# Patient Record
Sex: Male | Born: 1960 | Race: White | Hispanic: No | Marital: Married | Smoking: Never smoker
Health system: Southern US, Community
[De-identification: ages and names within clinical notes are randomized; demographics above are authoritative.]

## PROBLEM LIST (undated history)

## (undated) DIAGNOSIS — Z9889 Other specified postprocedural states: Secondary | ICD-10-CM

## (undated) DIAGNOSIS — T7840XA Allergy, unspecified, initial encounter: Secondary | ICD-10-CM

## (undated) DIAGNOSIS — E162 Hypoglycemia, unspecified: Secondary | ICD-10-CM

## (undated) DIAGNOSIS — H532 Diplopia: Secondary | ICD-10-CM

## (undated) DIAGNOSIS — K219 Gastro-esophageal reflux disease without esophagitis: Secondary | ICD-10-CM

## (undated) HISTORY — DX: Diplopia: H53.2

## (undated) HISTORY — PX: TONSILLECTOMY: SUR1361

## (undated) HISTORY — PX: OTHER SURGICAL HISTORY: SHX169

## (undated) HISTORY — DX: Gastro-esophageal reflux disease without esophagitis: K21.9

## (undated) HISTORY — DX: Allergy, unspecified, initial encounter: T78.40XA

---

## 2013-08-24 HISTORY — PX: COLONOSCOPY: SHX174

## 2015-12-04 ENCOUNTER — Ambulatory Visit: Payer: Self-pay | Admitting: Family Medicine

## 2015-12-09 ENCOUNTER — Ambulatory Visit (INDEPENDENT_AMBULATORY_CARE_PROVIDER_SITE_OTHER): Payer: BLUE CROSS/BLUE SHIELD | Admitting: Family Medicine

## 2015-12-09 ENCOUNTER — Encounter: Payer: Self-pay | Admitting: Family Medicine

## 2015-12-09 VITALS — BP 120/80 | HR 72 | Ht 75.0 in | Wt 175.0 lb

## 2015-12-09 DIAGNOSIS — K219 Gastro-esophageal reflux disease without esophagitis: Secondary | ICD-10-CM

## 2015-12-09 DIAGNOSIS — Z833 Family history of diabetes mellitus: Secondary | ICD-10-CM | POA: Diagnosis not present

## 2015-12-09 DIAGNOSIS — E162 Hypoglycemia, unspecified: Secondary | ICD-10-CM

## 2015-12-09 DIAGNOSIS — K4020 Bilateral inguinal hernia, without obstruction or gangrene, not specified as recurrent: Secondary | ICD-10-CM | POA: Diagnosis not present

## 2015-12-09 DIAGNOSIS — E161 Other hypoglycemia: Secondary | ICD-10-CM

## 2015-12-09 DIAGNOSIS — E559 Vitamin D deficiency, unspecified: Secondary | ICD-10-CM

## 2015-12-09 DIAGNOSIS — Z800 Family history of malignant neoplasm of digestive organs: Secondary | ICD-10-CM | POA: Diagnosis not present

## 2015-12-09 MED ORDER — RANITIDINE HCL 150 MG PO TABS: 150.0000 mg | ORAL_TABLET | Freq: Two times a day (BID) | ORAL | Status: DC

## 2015-12-10 DIAGNOSIS — E559 Vitamin D deficiency, unspecified: Secondary | ICD-10-CM | POA: Insufficient documentation

## 2015-12-10 DIAGNOSIS — K635 Polyp of colon: Secondary | ICD-10-CM | POA: Insufficient documentation

## 2015-12-10 DIAGNOSIS — Z833 Family history of diabetes mellitus: Secondary | ICD-10-CM | POA: Insufficient documentation

## 2015-12-10 LAB — CBC
HEMATOCRIT: 47 % (ref 37.5–51.0)
HEMOGLOBIN: 15.9 g/dL (ref 12.6–17.7)
MCH: 30.5 pg (ref 26.6–33.0)
MCHC: 33.8 g/dL (ref 31.5–35.7)
MCV: 90 fL (ref 79–97)
Platelets: 210 10*3/uL (ref 150–379)
RBC: 5.21 x10E6/uL (ref 4.14–5.80)
RDW: 13.3 % (ref 12.3–15.4)
WBC: 6.3 10*3/uL (ref 3.4–10.8)

## 2015-12-10 LAB — COMPREHENSIVE METABOLIC PANEL
ALK PHOS: 65 IU/L (ref 39–117)
ALT: 16 IU/L (ref 0–44)
AST: 16 IU/L (ref 0–40)
Albumin/Globulin Ratio: 2.2 (ref 1.2–2.2)
Albumin: 4.8 g/dL (ref 3.5–5.5)
BILIRUBIN TOTAL: 0.6 mg/dL (ref 0.0–1.2)
BUN/Creatinine Ratio: 20 (ref 9–20)
BUN: 19 mg/dL (ref 6–24)
CHLORIDE: 102 mmol/L (ref 96–106)
CO2: 25 mmol/L (ref 18–29)
Calcium: 9 mg/dL (ref 8.7–10.2)
Creatinine, Ser: 0.97 mg/dL (ref 0.76–1.27)
GFR calc Af Amer: 100 mL/min/{1.73_m2} (ref >59)
GFR calc non Af Amer: 87 mL/min/{1.73_m2} (ref >59)
GLUCOSE: 99 mg/dL (ref 65–99)
Globulin, Total: 2.2 g/dL (ref 1.5–4.5)
Potassium: 4.3 mmol/L (ref 3.5–5.2)
Sodium: 142 mmol/L (ref 134–144)
Total Protein: 7 g/dL (ref 6.0–8.5)

## 2015-12-10 LAB — TSH: TSH: 0.993 u[IU]/mL (ref 0.450–4.500)

## 2015-12-10 LAB — HEMOGLOBIN A1C
ESTIMATED AVERAGE GLUCOSE: 114 mg/dL
Hgb A1c MFr Bld: 5.6 % (ref 4.8–5.6)

## 2015-12-10 LAB — VITAMIN B12: VITAMIN B 12: 340 pg/mL (ref 211–946)

## 2015-12-10 LAB — VITAMIN D 25 HYDROXY (VIT D DEFICIENCY, FRACTURES): VIT D 25 HYDROXY: 22 ng/mL — AB (ref 30.0–100.0)

## 2015-12-10 MED ORDER — VITAMIN D3 25 MCG (1000 UT) PO CAPS: 1.0000 | ORAL_CAPSULE | Freq: Every day | ORAL | Status: DC

## 2015-12-10 NOTE — Addendum Note (Signed)
Addended by: Adline Potter on: 12/10/2015 08:33 AM   Modules accepted: Orders

## 2015-12-10 NOTE — Progress Notes (Signed)
Date:  12/09/2015   Name:  John Ashley   DOB:  [birth year]   MRN:  [id number]  PCP:  Adline Potter, MD    Chief Complaint: Establish Care   History of Present Illness:  This is a 55 y.o. male seen to establish care. Hx reactive hypoglycemia he manages with diet. GERD x 3-4 yrs on chronic PPI, sxs recur if misses dose, has not tried H2 blocker, EGD negative in past. Known painless bilateral inguinal hernias x yrs, L has gotten a little larger lately. Father with colon ca/DM, mother with DM, brother with colon ca. Colonoscopy 2015 small polyps for repeat 2018. Last tetanus imm 2015.  Review of Systems:  Review of Systems  Constitutional: Negative for fever and fatigue.  Respiratory: Negative for cough and shortness of breath.   Cardiovascular: Negative for chest pain and leg swelling.  Endocrine: Negative for polyuria.  Genitourinary: Negative for difficulty urinating.  Neurological: Negative for syncope and light-headedness.    Patient Active Problem List   Diagnosis Date Noted  . FH: diabetes mellitus 12/10/2015  . FH: colon cancer 12/10/2015  . Reactive hypoglycemia 12/09/2015  . Bilateral inguinal hernia 12/09/2015    Prior to Admission medications   Medication Sig Start Date End Date Taking? Authorizing Provider  cetirizine (ZYRTEC) 10 MG tablet Take 10 mg by mouth daily. otc   Yes Historical Provider, MD  ranitidine (ZANTAC) 150 MG tablet Take 1 tablet (150 mg total) by mouth 2 (two) times daily. 12/09/15   Adline Potter, MD    No Known Allergies  Past Surgical History  Procedure Laterality Date  . Perforated eardrum Right   . Tonsillectomy    . Colonoscopy  2015    repeat in 3 years d/t polyps    Social History  Substance Use Topics  . Smoking status: Never Smoker   . Smokeless tobacco: None  . Alcohol Use: 0.0 oz/week    0 Standard drinks or equivalent per week    Family History  Problem Relation Age of Onset  . Diabetes Mother   . Cancer Father   .  Diabetes Father   . Cancer Brother   . Cancer Paternal Grandmother   . Cancer Paternal Grandfather     Medication list has been reviewed and updated.  Physical Examination: BP 120/80 mmHg  Pulse 72  Ht 6\' 3"  (1.905 m)  Wt 175 lb (79.379 kg)  BMI 21.87 kg/m2  Physical Exam  Constitutional: He is oriented to person, place, and time. He appears well-developed and well-nourished.  HENT:  Head: Normocephalic and atraumatic.  Right Ear: External ear normal.  Left Ear: External ear normal.  Nose: Nose normal.  Mouth/Throat: Oropharynx is clear and moist.  R TM with scarring  Eyes: Conjunctivae and EOM are normal. Pupils are equal, round, and reactive to light.  Neck: Neck supple. No thyromegaly present.  Cardiovascular: Normal rate, regular rhythm and normal heart sounds.   Pulmonary/Chest: Effort normal and breath sounds normal.  Abdominal: Soft. He exhibits no distension and no mass. There is no tenderness.  Genitourinary: Penis normal.  B indirect inguinal hernias L>R, nontender, easily reducible, testes normal  Musculoskeletal: He exhibits no edema.  Lymphadenopathy:    He has no cervical adenopathy.  Neurological: He is alert and oriented to person, place, and time.  Skin: Skin is warm and dry.  Psychiatric: He has a normal mood and affect. His behavior is normal.  Nursing note and vitals reviewed.   Assessment and Plan:  1.  Reactive hypoglycemia Well managed with diet - Comprehensive Metabolic Panel (CMET) - CBC - HgB A1c - TSH  2. Bilateral inguinal hernia without obstruction or gangrene, recurrence not specified Nonpainful, consider surgical consult if become symptomatic  3. Gastroesophageal reflux disease without esophagitis Discussed risks chronic PPI use, will try change to Zantac - B12 - Vitamin D (25 hydroxy)  4. FH: diabetes mellitus Lifetime risk of diabetes high  5. FH: colon cancer For repeat colonoscopy 2018  Return in about 4 weeks (around  01/06/2016).  Satira Anis. [facility]  12/10/2015

## 2016-01-08 ENCOUNTER — Encounter: Payer: Self-pay | Admitting: Family Medicine

## 2016-01-08 ENCOUNTER — Ambulatory Visit (INDEPENDENT_AMBULATORY_CARE_PROVIDER_SITE_OTHER): Payer: BLUE CROSS/BLUE SHIELD | Admitting: Family Medicine

## 2016-01-08 VITALS — BP 106/76 | HR 84 | Resp 16 | Ht 73.0 in | Wt 171.0 lb

## 2016-01-08 DIAGNOSIS — J309 Allergic rhinitis, unspecified: Secondary | ICD-10-CM

## 2016-01-08 DIAGNOSIS — E162 Hypoglycemia, unspecified: Secondary | ICD-10-CM

## 2016-01-08 DIAGNOSIS — E559 Vitamin D deficiency, unspecified: Secondary | ICD-10-CM | POA: Diagnosis not present

## 2016-01-08 DIAGNOSIS — E161 Other hypoglycemia: Secondary | ICD-10-CM

## 2016-01-08 DIAGNOSIS — K219 Gastro-esophageal reflux disease without esophagitis: Secondary | ICD-10-CM | POA: Diagnosis not present

## 2016-01-08 DIAGNOSIS — K4020 Bilateral inguinal hernia, without obstruction or gangrene, not specified as recurrent: Secondary | ICD-10-CM | POA: Diagnosis not present

## 2016-01-08 NOTE — Progress Notes (Signed)
Date:  01/08/2016   Name:  John Ashley   DOB:  22-Oct-1960   MRN:  [id number]  PCP:  Adline Potter, MD    Chief Complaint: Heartburn   History of Present Illness:  This is a 55 y.o. male seen in one month f/u from initial visit. Blood work ok except for vit D def, on supplement. Tried to transition to Zantac but GERD sxs recurred and restarted omeprazole with resolution. No change in BIH, reactive hypoglycemia, or AR sxs.   Review of Systems:  Review of Systems  Constitutional: Negative for fever and fatigue.  Respiratory: Negative for cough and shortness of breath.   Cardiovascular: Negative for chest pain and leg swelling.  Neurological: Negative for syncope and light-headedness.    Patient Active Problem List   Diagnosis Date Noted  . GERD (gastroesophageal reflux disease) 01/08/2016  . FH: diabetes mellitus 12/10/2015  . FH: colon cancer 12/10/2015  . Vitamin D deficiency 12/10/2015  . Reactive hypoglycemia 12/09/2015  . Bilateral inguinal hernia 12/09/2015    Prior to Admission medications   Medication Sig Start Date End Date Taking? Authorizing Provider  cetirizine (ZYRTEC) 10 MG tablet Take 10 mg by mouth daily. otc   Yes Historical Provider, MD  Cholecalciferol (VITAMIN D3) 1000 units CAPS Take 1 capsule (1,000 Units total) by mouth daily. 12/10/15  Yes Adline Potter, MD  omeprazole (PRILOSEC) 20 MG capsule Take 20 mg by mouth daily.   Yes Historical Provider, MD    No Known Allergies  Past Surgical History  Procedure Laterality Date  . Perforated eardrum Right   . Tonsillectomy    . Colonoscopy  2015    repeat in 3 years d/t polyps    Social History  Substance Use Topics  . Smoking status: Never Smoker   . Smokeless tobacco: None  . Alcohol Use: 0.0 oz/week    0 Standard drinks or equivalent per week    Family History  Problem Relation Age of Onset  . Diabetes Mother   . Cancer Father   . Diabetes Father   . Cancer Brother   . Cancer Paternal  Grandmother   . Cancer Paternal Grandfather     Medication list has been reviewed and updated.  Physical Examination: BP 106/76 mmHg  Pulse 84  Resp 16  Ht 6\' 1"  (1.854 m)  Wt 171 lb (77.565 kg)  BMI 22.57 kg/m2  Physical Exam  Constitutional: He appears well-developed and well-nourished.  Cardiovascular: Normal rate and regular rhythm.   Pulmonary/Chest: Effort normal and breath sounds normal.  Musculoskeletal: He exhibits no edema.  Neurological: He is alert.  Skin: Skin is warm and dry.  Psychiatric: He has a normal mood and affect. His behavior is normal.  Nursing note and vitals reviewed.   Assessment and Plan:  1. Vitamin D deficiency On supplement - Vitamin D (25 hydroxy)  2. Gastroesophageal reflux disease, esophagitis presence not specified Intolerant of transition to H2 blocker, negative EGD in past, cont omeprazole  3. Bilateral inguinal hernia without obstruction or gangrene, recurrence not specified Stable  4. Reactive hypoglycemia Stable with small, frequent meals  5. Allergic rhinitis, unspecified allergic rhinitis type On chronic Zyrtec, consider d/c  Return in about 6 months (around 07/10/2016).  Satira Anis. [facility]  01/08/2016

## 2016-01-09 LAB — VITAMIN D 25 HYDROXY (VIT D DEFICIENCY, FRACTURES): VIT D 25 HYDROXY: 29.6 ng/mL — AB (ref 30.0–100.0)

## 2016-08-14 ENCOUNTER — Ambulatory Visit (INDEPENDENT_AMBULATORY_CARE_PROVIDER_SITE_OTHER): Payer: BLUE CROSS/BLUE SHIELD | Admitting: Internal Medicine

## 2016-08-14 ENCOUNTER — Encounter: Payer: Self-pay | Admitting: Internal Medicine

## 2016-08-14 VITALS — BP 122/88 | HR 73 | Temp 98.1°F | Ht 75.0 in | Wt 170.0 lb

## 2016-08-14 DIAGNOSIS — J0100 Acute maxillary sinusitis, unspecified: Secondary | ICD-10-CM

## 2016-08-14 MED ORDER — AMOXICILLIN-POT CLAVULANATE 875-125 MG PO TABS: 1.0000 | ORAL_TABLET | Freq: Two times a day (BID) | ORAL | 0 refills | Status: DC

## 2016-08-14 NOTE — Progress Notes (Signed)
    Date:  08/14/2016   Name:  John Ashley   DOB:  1961/04/12   MRN:  [id number]   Chief Complaint: Sinus Problem (Pt stated sinus pressure for 3 weeks.) Sinus Problem  This is a new problem. The current episode started 1 to 4 weeks ago. The problem is unchanged. There has been no fever. The pain is moderate. Associated symptoms include coughing and sinus pressure. Pertinent negatives include no chills, ear pain, hoarse voice or shortness of breath. Treatments tried: Dayquil. The treatment provided mild relief.      Review of Systems  Constitutional: Negative for chills.  HENT: Positive for postnasal drip and sinus pressure. Negative for ear pain and hoarse voice.   Respiratory: Positive for cough. Negative for chest tightness, shortness of breath and wheezing.   Cardiovascular: Negative for chest pain.    Patient Active Problem List   Diagnosis Date Noted  . GERD (gastroesophageal reflux disease) 01/08/2016  . Allergic rhinitis 01/08/2016  . FH: diabetes mellitus 12/10/2015  . FH: colon cancer 12/10/2015  . Vitamin D deficiency 12/10/2015  . Reactive hypoglycemia 12/09/2015  . Bilateral inguinal hernia 12/09/2015    Prior to Admission medications   Medication Sig Start Date End Date Taking? Authorizing Provider  cetirizine (ZYRTEC) 10 MG tablet Take 10 mg by mouth daily. otc   Yes Historical Provider, MD  Cholecalciferol (VITAMIN D3) 2000 units capsule Take 2,000 Units by mouth daily.   Yes Historical Provider, MD  omeprazole (PRILOSEC) 20 MG capsule Take 20 mg by mouth daily.   Yes Historical Provider, MD    No Known Allergies  Past Surgical History:  Procedure Laterality Date  . COLONOSCOPY  2015   repeat in 3 years d/t polyps  . perforated eardrum Right   . TONSILLECTOMY      Social History  Substance Use Topics  . Smoking status: Never Smoker  . Smokeless tobacco: Never Used  . Alcohol use 0.0 oz/week     Medication list has been reviewed and  updated.   Physical Exam  Constitutional: He is oriented to person, place, and time. He appears well-developed and well-nourished.  HENT:  Right Ear: External ear and ear canal normal. Tympanic membrane is not erythematous and not retracted.  Left Ear: External ear and ear canal normal. Tympanic membrane is scarred. Tympanic membrane is not erythematous and not retracted.  Nose: Right sinus exhibits maxillary sinus tenderness. Right sinus exhibits no frontal sinus tenderness. Left sinus exhibits maxillary sinus tenderness. Left sinus exhibits no frontal sinus tenderness.  Mouth/Throat: Uvula is midline and mucous membranes are normal. No oral lesions. Posterior oropharyngeal erythema present. No oropharyngeal exudate or posterior oropharyngeal edema.  Cardiovascular: Normal rate, regular rhythm and normal heart sounds.   Pulmonary/Chest: Breath sounds normal. He has no wheezes. He has no rales.  Lymphadenopathy:    He has no cervical adenopathy.  Neurological: He is alert and oriented to person, place, and time.    BP 122/88   Pulse 73   Temp 98.1 F (36.7 C)   Ht 6\' 3"  (1.905 m)   Wt 170 lb (77.1 kg)   SpO2 99%   BMI 21.25 kg/m   Assessment and Plan: 1. Acute non-recurrent maxillary sinusitis Begin daily Zyrtec Delsym as needed for cough - amoxicillin-clavulanate (AUGMENTIN) 875-125 MG tablet; Take 1 tablet by mouth 2 (two) times daily.  Dispense: 20 tablet; Refill: 0   Halina Maidens, MD [facility]  08/14/2016

## 2016-11-17 ENCOUNTER — Other Ambulatory Visit: Payer: Self-pay | Admitting: Internal Medicine

## 2016-11-17 ENCOUNTER — Other Ambulatory Visit: Payer: Self-pay

## 2016-11-17 MED ORDER — OMEPRAZOLE 20 MG PO CPDR: 20.0000 mg | DELAYED_RELEASE_CAPSULE | Freq: Every day | ORAL | 5 refills | Status: DC

## 2016-12-21 ENCOUNTER — Encounter: Payer: Self-pay | Admitting: Internal Medicine

## 2016-12-21 ENCOUNTER — Ambulatory Visit (INDEPENDENT_AMBULATORY_CARE_PROVIDER_SITE_OTHER): Payer: Managed Care, Other (non HMO) | Admitting: Internal Medicine

## 2016-12-21 VITALS — BP 108/70 | HR 50 | Ht 75.0 in | Wt 177.2 lb

## 2016-12-21 DIAGNOSIS — N400 Enlarged prostate without lower urinary tract symptoms: Secondary | ICD-10-CM | POA: Diagnosis not present

## 2016-12-21 DIAGNOSIS — E161 Other hypoglycemia: Secondary | ICD-10-CM | POA: Diagnosis not present

## 2016-12-21 DIAGNOSIS — Z0000 Encounter for general adult medical examination without abnormal findings: Secondary | ICD-10-CM | POA: Diagnosis not present

## 2016-12-21 DIAGNOSIS — K219 Gastro-esophageal reflux disease without esophagitis: Secondary | ICD-10-CM | POA: Diagnosis not present

## 2016-12-21 DIAGNOSIS — Z1159 Encounter for screening for other viral diseases: Secondary | ICD-10-CM

## 2016-12-21 DIAGNOSIS — K635 Polyp of colon: Secondary | ICD-10-CM

## 2016-12-21 LAB — POCT URINALYSIS DIPSTICK
BILIRUBIN UA: NEGATIVE
Glucose, UA: NEGATIVE
Ketones, UA: NEGATIVE
LEUKOCYTES UA: NEGATIVE
NITRITE UA: POSITIVE
PH UA: 5 (ref 5.0–8.0)
Protein, UA: NEGATIVE
Spec Grav, UA: 1.02 (ref 1.010–1.025)
Urobilinogen, UA: 0.2 E.U./dL

## 2016-12-21 NOTE — Patient Instructions (Signed)
 Health Maintenance, Male A healthy lifestyle and preventive care is important for your health and wellness. Ask your health care provider about what schedule of regular examinations is right for you. What should I know about weight and diet?  Eat a Healthy Diet  Eat plenty of vegetables, fruits, whole grains, low-fat dairy products, and lean protein.  Do not eat a lot of foods high in solid fats, added sugars, or salt. Maintain a Healthy Weight  Regular exercise can help you achieve or maintain a healthy weight. You should:  Do at least 150 minutes of exercise each week. The exercise should increase your heart rate and make you sweat (moderate-intensity exercise).  Do strength-training exercises at least twice a week. Watch Your Levels of Cholesterol and Blood Lipids  Have your blood tested for lipids and cholesterol every 5 years starting at 58 years of age. If you are at high risk for heart disease, you should start having your blood tested when you are 58 years old. You may need to have your cholesterol levels checked more often if:  Your lipid or cholesterol levels are high.  You are older than 58 years of age.  You are at high risk for heart disease. What should I know about cancer screening? Many types of cancers can be detected early and may often be prevented. Lung Cancer  You should be screened every year for lung cancer if:  You are a current smoker who has smoked for at least 30 years.  You are a former smoker who has quit within the past 15 years.  Talk to your health care provider about your screening options, when you should start screening, and how often you should be screened. Colorectal Cancer  Routine colorectal cancer screening usually begins at 58 years of age and should be repeated every 5-10 years until you are 58 years old. You may need to be screened more often if early forms of precancerous polyps or small growths are found. Your health care provider  may recommend screening at an earlier age if you have risk factors for colon cancer.  Your health care provider may recommend using home test kits to check for hidden blood in the stool.  A small camera at the end of a tube can be used to examine your colon (sigmoidoscopy or colonoscopy). This checks for the earliest forms of colorectal cancer. Prostate and Testicular Cancer  Depending on your age and overall health, your health care provider may do certain tests to screen for prostate and testicular cancer.  Talk to your health care provider about any symptoms or concerns you have about testicular or prostate cancer. Skin Cancer  Check your skin from head to toe regularly.  Tell your health care provider about any new moles or changes in moles, especially if:  There is a change in a mole's size, shape, or color.  You have a mole that is larger than a pencil eraser.  Always use sunscreen. Apply sunscreen liberally and repeat throughout the day.  Protect yourself by wearing long sleeves, pants, a wide-brimmed hat, and sunglasses when outside. What should I know about heart disease, diabetes, and high blood pressure?  If you are 18-39 years of age, have your blood pressure checked every 3-5 years. If you are 40 years of age or older, have your blood pressure checked every year. You should have your blood pressure measured twice-once when you are at a hospital or clinic, and once when you are not at   a hospital or clinic. Record the average of the two measurements. To check your blood pressure when you are not at a hospital or clinic, you can use:  An automated blood pressure machine at a pharmacy.  A home blood pressure monitor.  Talk to your health care provider about your target blood pressure.  If you are between 45-79 years old, ask your health care provider if you should take aspirin to prevent heart disease.  Have regular diabetes screenings by checking your fasting blood sugar  level.  If you are at a normal weight and have a low risk for diabetes, have this test once every three years after the age of 45.  If you are overweight and have a high risk for diabetes, consider being tested at a younger age or more often.  A one-time screening for abdominal aortic aneurysm (AAA) by ultrasound is recommended for men aged 65-75 years who are current or former smokers. What should I know about preventing infection? Hepatitis B  If you have a higher risk for hepatitis B, you should be screened for this virus. Talk with your health care provider to find out if you are at risk for hepatitis B infection. Hepatitis C  Blood testing is recommended for:  Everyone born from 1945 through 1965.  Anyone with known risk factors for hepatitis C. Sexually Transmitted Diseases (STDs)  You should be screened each year for STDs including gonorrhea and chlamydia if:  You are sexually active and are younger than 58 years of age.  You are older than 58 years of age and your health care provider tells you that you are at risk for this type of infection.  Your sexual activity has changed since you were last screened and you are at an increased risk for chlamydia or gonorrhea. Ask your health care provider if you are at risk.  Talk with your health care provider about whether you are at high risk of being infected with HIV. Your health care provider may recommend a prescription medicine to help prevent HIV infection. What else can I do?  Schedule regular health, dental, and eye exams.  Stay current with your vaccines (immunizations).  Do not use any tobacco products, such as cigarettes, chewing tobacco, and e-cigarettes. If you need help quitting, ask your health care provider.  Limit alcohol intake to no more than 2 drinks per day. One drink equals 12 ounces of beer, 5 ounces of wine, or 1 ounces of hard liquor.  Do not use street drugs.  Do not share needles.  Ask your health  care provider for help if you need support or information about quitting drugs.  Tell your health care provider if you often feel depressed.  Tell your health care provider if you have ever been abused or do not feel safe at home. This information is not intended to replace advice given to you by your health care provider. Make sure you discuss any questions you have with your health care provider. Document Released: 02/06/2008 Document Revised: 04/08/2016 Document Reviewed: 05/14/2015 Elsevier Interactive Patient Education  2017 [employer] Inc[employer]  

## 2016-12-21 NOTE — Progress Notes (Signed)
Date:  12/21/2016   Name:  John Ashley   DOB:  1961-01-18   MRN:  [id number]   Chief Complaint: Annual Exam Kawon Willcutt is a 56 y.o. male who presents today for his Complete Annual Exam. He feels well. He reports exercising none. He reports he is sleeping well.   Gastroesophageal Reflux  He complains of heartburn. He reports no abdominal pain, no chest pain, no choking or no wheezing. This is a recurrent problem. The problem occurs rarely. The problem has been unchanged. Pertinent negatives include no fatigue. He has tried a PPI for the symptoms.   Reactive hypoglycemia - occurs several times per month but he has learned to control it with frequent small meals.  His sx are tremor and vision changes.   Review of Systems  Constitutional: Negative for appetite change, chills, diaphoresis, fatigue and unexpected weight change.  HENT: Negative for hearing loss, tinnitus, trouble swallowing and voice change.   Eyes: Negative for visual disturbance.  Respiratory: Negative for choking, shortness of breath and wheezing.   Cardiovascular: Negative for chest pain, palpitations and leg swelling.  Gastrointestinal: Positive for heartburn. Negative for abdominal pain, blood in stool, constipation and diarrhea.  Genitourinary: Negative for difficulty urinating, dysuria and frequency.       Mild hesitancy  Musculoskeletal: Negative for arthralgias, back pain and myalgias.  Skin: Negative for color change and rash.  Neurological: Negative for dizziness, syncope and headaches.  Hematological: Negative for adenopathy.  Psychiatric/Behavioral: Negative for dysphoric mood and sleep disturbance.    Patient Active Problem List   Diagnosis Date Noted  . GERD (gastroesophageal reflux disease) 01/08/2016  . Allergic rhinitis 01/08/2016  . FH: diabetes mellitus 12/10/2015  . Colon polyp, hyperplastic 12/10/2015  . Vitamin D deficiency 12/10/2015  . Reactive hypoglycemia 12/09/2015  . Bilateral  inguinal hernia 12/09/2015    Prior to Admission medications   Medication Sig Start Date End Date Taking? Authorizing Provider  cetirizine (ZYRTEC) 10 MG tablet Take 10 mg by mouth daily. otc   Yes Historical Provider, MD  Cholecalciferol (VITAMIN D3) 2000 units capsule Take 2,000 Units by mouth daily.   Yes Historical Provider, MD  omeprazole (PRILOSEC) 20 MG capsule Take 1 capsule (20 mg total) by mouth daily. 11/17/16  Yes Glean Hess, MD    No Known Allergies  Past Surgical History:  Procedure Laterality Date  . COLONOSCOPY  2015   repeat in 3 years d/t polyps  . perforated eardrum Right   . TONSILLECTOMY      Social History  Substance Use Topics  . Smoking status: Never Smoker  . Smokeless tobacco: Never Used  . Alcohol use 0.0 oz/week     Medication list has been reviewed and updated.   Physical Exam  Constitutional: He is oriented to person, place, and time. He appears well-developed and well-nourished.  HENT:  Head: Normocephalic.  Right Ear: Tympanic membrane, external ear and ear canal normal.  Left Ear: Tympanic membrane, external ear and ear canal normal.  Nose: Nose normal.  Mouth/Throat: Uvula is midline and oropharynx is clear and moist.  Eyes: Conjunctivae and EOM are normal. Pupils are equal, round, and reactive to light.  Neck: Normal range of motion. Neck supple. Carotid bruit is not present. No thyromegaly present.  Cardiovascular: Normal rate, regular rhythm, normal heart sounds and intact distal pulses.   Pulmonary/Chest: Effort normal and breath sounds normal. He has no wheezes. Right breast exhibits no mass. Left breast exhibits no mass.  Abdominal:  Soft. Normal appearance and bowel sounds are normal. There is no hepatosplenomegaly. There is no tenderness. A hernia is present. Hernia confirmed positive in the right inguinal area and confirmed positive in the left inguinal area.  Genitourinary: Prostate is enlarged (mild increase in size of right  gland - no nodules). Prostate is not tender.  Musculoskeletal: Normal range of motion.  Lymphadenopathy:    He has no cervical adenopathy.  Neurological: He is alert and oriented to person, place, and time. He has normal reflexes.  Skin: Skin is warm, dry and intact.  Psychiatric: He has a normal mood and affect. His speech is normal and behavior is normal. Judgment and thought content normal.  Nursing note and vitals reviewed.   BP 108/70 (BP Location: Right Arm, Patient Position: Sitting, Cuff Size: Normal)   Pulse (!) 50   Ht 6\' 3"  (1.905 m)   Wt 177 lb 3.2 oz (80.4 kg)   SpO2 100%   BMI 22.15 kg/m   Assessment and Plan: 1. Annual physical exam - Lipid panel - POCT urinalysis dipstick  2. Gastroesophageal reflux disease, esophagitis presence not specified Continue lowest effective dose PPI - CBC with Differential/Platelet  3. Hyperplastic colonic polyp, unspecified part of colon Due this year for colonoscopy - Ambulatory referral to Gastroenterology  4. Reactive hypoglycemia Continue current management - Comprehensive metabolic panel - Hemoglobin A1c  5. Need for hepatitis C screening test - Hepatitis C antibody  6. Enlarged prostate on rectal examination - PSA   No orders of the defined types were placed in this encounter.   Halina Maidens, MD [facility]  12/21/2016

## 2016-12-22 ENCOUNTER — Telehealth: Payer: Self-pay | Admitting: Gastroenterology

## 2016-12-22 LAB — COMPREHENSIVE METABOLIC PANEL
ALT: 18 IU/L (ref 0–44)
AST: 19 IU/L (ref 0–40)
Albumin/Globulin Ratio: 2 (ref 1.2–2.2)
Albumin: 4.9 g/dL (ref 3.5–5.5)
Alkaline Phosphatase: 59 IU/L (ref 39–117)
BUN/Creatinine Ratio: 25 — ABNORMAL HIGH (ref 9–20)
BUN: 19 mg/dL (ref 6–24)
Bilirubin Total: 0.6 mg/dL (ref 0.0–1.2)
CALCIUM: 9 mg/dL (ref 8.7–10.2)
CO2: 25 mmol/L (ref 18–29)
CREATININE: 0.76 mg/dL (ref 0.76–1.27)
Chloride: 99 mmol/L (ref 96–106)
GFR, EST AFRICAN AMERICAN: 117 mL/min/{1.73_m2} (ref >59)
GFR, EST NON AFRICAN AMERICAN: 101 mL/min/{1.73_m2} (ref >59)
GLOBULIN, TOTAL: 2.4 g/dL (ref 1.5–4.5)
Glucose: 84 mg/dL (ref 65–99)
Potassium: 4.1 mmol/L (ref 3.5–5.2)
SODIUM: 142 mmol/L (ref 134–144)
TOTAL PROTEIN: 7.3 g/dL (ref 6.0–8.5)

## 2016-12-22 LAB — LIPID PANEL
CHOL/HDL RATIO: 3.4 ratio (ref 0.0–5.0)
Cholesterol, Total: 172 mg/dL (ref 100–199)
HDL: 50 mg/dL (ref >39)
LDL CALC: 106 mg/dL — AB (ref 0–99)
TRIGLYCERIDES: 81 mg/dL (ref 0–149)
VLDL Cholesterol Cal: 16 mg/dL (ref 5–40)

## 2016-12-22 LAB — CBC WITH DIFFERENTIAL/PLATELET
BASOS: 1 %
Basophils Absolute: 0 10*3/uL (ref 0.0–0.2)
EOS (ABSOLUTE): 0 10*3/uL (ref 0.0–0.4)
EOS: 1 %
HEMOGLOBIN: 16.6 g/dL (ref 13.0–17.7)
Hematocrit: 48.2 % (ref 37.5–51.0)
IMMATURE GRANS (ABS): 0 10*3/uL (ref 0.0–0.1)
IMMATURE GRANULOCYTES: 0 %
LYMPHS: 29 %
Lymphocytes Absolute: 1.1 10*3/uL (ref 0.7–3.1)
MCH: 31 pg (ref 26.6–33.0)
MCHC: 34.4 g/dL (ref 31.5–35.7)
MCV: 90 fL (ref 79–97)
MONOCYTES: 11 %
MONOS ABS: 0.4 10*3/uL (ref 0.1–0.9)
NEUTROS PCT: 58 %
Neutrophils Absolute: 2.3 10*3/uL (ref 1.4–7.0)
Platelets: 206 10*3/uL (ref 150–379)
RBC: 5.36 x10E6/uL (ref 4.14–5.80)
RDW: 13.6 % (ref 12.3–15.4)
WBC: 3.9 10*3/uL (ref 3.4–10.8)

## 2016-12-22 LAB — HEMOGLOBIN A1C
Est. average glucose Bld gHb Est-mCnc: 111 mg/dL
HEMOGLOBIN A1C: 5.5 % (ref 4.8–5.6)

## 2016-12-22 LAB — HEPATITIS C ANTIBODY

## 2016-12-22 LAB — PSA: Prostate Specific Ag, Serum: 1.7 ng/mL (ref 0.0–4.0)

## 2016-12-22 NOTE — Telephone Encounter (Signed)
Patient is returning a call to schedule a colonoscopy °

## 2016-12-25 ENCOUNTER — Other Ambulatory Visit: Payer: Self-pay

## 2016-12-25 ENCOUNTER — Telehealth: Payer: Self-pay | Admitting: Gastroenterology

## 2016-12-25 DIAGNOSIS — K635 Polyp of colon: Secondary | ICD-10-CM

## 2016-12-25 NOTE — Telephone Encounter (Signed)
Pts call has been returned and he is scheduled for colonoscopy 02/02/17 Dr. Allen Norris [facility]

## 2016-12-25 NOTE — Telephone Encounter (Signed)
12/25/16 Faxed Prior Auth Form to [employer] for Colonoscopy [phone] / [id number]

## 2016-12-30 ENCOUNTER — Telehealth: Payer: Self-pay | Admitting: Gastroenterology

## 2016-12-30 ENCOUNTER — Other Ambulatory Visit: Payer: Self-pay

## 2016-12-30 DIAGNOSIS — Z1211 Encounter for screening for malignant neoplasm of colon: Secondary | ICD-10-CM

## 2016-12-30 NOTE — Telephone Encounter (Signed)
Pt has rescheduled his colonoscopy to 02/08/17 at [facility].

## 2016-12-30 NOTE — Telephone Encounter (Signed)
Patient John Ashley and needs to r/s his procedure on 02/02/17.

## 2017-02-02 ENCOUNTER — Encounter: Payer: Self-pay | Admitting: *Deleted

## 2017-02-02 ENCOUNTER — Ambulatory Visit: Admit: 2017-02-02 | Payer: Self-pay | Admitting: Gastroenterology

## 2017-02-02 SURGERY — COLONOSCOPY WITH PROPOFOL
Anesthesia: General

## 2017-02-05 NOTE — Discharge Instructions (Signed)
General Anesthesia, Adult, Care After °These instructions provide you with information about caring for yourself after your procedure. Your health care provider may also give you more specific instructions. Your treatment has been planned according to current medical practices, but problems sometimes occur. Call your health care provider if you have any problems or questions after your procedure. °What can I expect after the procedure? °After the procedure, it is common to have: °· Vomiting. °· A sore throat. °· Mental slowness. ° °It is common to feel: °· Nauseous. °· Cold or shivery. °· Sleepy. °· Tired. °· Sore or achy, even in parts of your body where you did not have surgery. ° °Follow these instructions at home: °For at least 24 hours after the procedure: °· Do not: °? Participate in activities where you could fall or become injured. °? Drive. °? Use heavy machinery. °? Drink alcohol. °? Take sleeping pills or medicines that cause drowsiness. °? Make important decisions or sign legal documents. °? Take care of children on your own. °· Rest. °Eating and drinking °· If you vomit, drink water, juice, or soup when you can drink without vomiting. °· Drink enough fluid to keep your urine clear or pale yellow. °· Make sure you have little or no nausea before eating solid foods. °· Follow the diet recommended by your health care provider. °General instructions °· Have a responsible adult stay with you until you are awake and alert. °· Return to your normal activities as told by your health care provider. Ask your health care provider what activities are safe for you. °· Take over-the-counter and prescription medicines only as told by your health care provider. °· If you smoke, do not smoke without supervision. °· Keep all follow-up visits as told by your health care provider. This is important. °Contact a health care provider if: °· You continue to have nausea or vomiting at home, and medicines are not helpful. °· You  cannot drink fluids or start eating again. °· You cannot urinate after 8-12 hours. °· You develop a skin rash. °· You have fever. °· You have increasing redness at the site of your procedure. °Get help right away if: °· You have difficulty breathing. °· You have chest pain. °· You have unexpected bleeding. °· You feel that you are having a life-threatening or urgent problem. °This information is not intended to replace advice given to you by your health care provider. Make sure you discuss any questions you have with your health care provider. °Document Released: 11/16/2000 Document Revised: 01/13/2016 Document Reviewed: 07/25/2015 °Elsevier Interactive Patient Education © 2018 [employer] ° °

## 2017-02-08 ENCOUNTER — Ambulatory Visit
Admission: RE | Admit: 2017-02-08 | Discharge: 2017-02-08 | Disposition: A | Discharge status: Home / Self Care | Payer: Managed Care, Other (non HMO) | Source: Ambulatory Visit | Attending: Gastroenterology | Admitting: Gastroenterology

## 2017-02-08 ENCOUNTER — Ambulatory Visit: Payer: Managed Care, Other (non HMO) | Admitting: Anesthesiology

## 2017-02-08 ENCOUNTER — Encounter
Admission: RE | Disposition: A | Discharge status: Home / Self Care | Payer: Self-pay | Source: Ambulatory Visit | Attending: Gastroenterology

## 2017-02-08 DIAGNOSIS — Z860100 Personal history of colon polyps, unspecified: Secondary | ICD-10-CM

## 2017-02-08 DIAGNOSIS — Z1211 Encounter for screening for malignant neoplasm of colon: Secondary | ICD-10-CM | POA: Insufficient documentation

## 2017-02-08 DIAGNOSIS — K219 Gastro-esophageal reflux disease without esophagitis: Secondary | ICD-10-CM | POA: Insufficient documentation

## 2017-02-08 DIAGNOSIS — Z79899 Other long term (current) drug therapy: Secondary | ICD-10-CM | POA: Insufficient documentation

## 2017-02-08 DIAGNOSIS — K641 Second degree hemorrhoids: Secondary | ICD-10-CM | POA: Insufficient documentation

## 2017-02-08 DIAGNOSIS — D122 Benign neoplasm of ascending colon: Secondary | ICD-10-CM

## 2017-02-08 HISTORY — PX: COLONOSCOPY WITH PROPOFOL: SHX5780

## 2017-02-08 HISTORY — PX: POLYPECTOMY: SHX5525

## 2017-02-08 HISTORY — DX: Hypoglycemia, unspecified: E16.2

## 2017-02-08 SURGERY — COLONOSCOPY WITH PROPOFOL
Anesthesia: General | Wound class: Contaminated

## 2017-02-08 MED ORDER — PROPOFOL 10 MG/ML IV BOLUS
INTRAVENOUS | Status: DC | PRN
  Administered 2017-02-08: 100 mg via INTRAVENOUS
  Administered 2017-02-08 (×2): 50 mg via INTRAVENOUS

## 2017-02-08 MED ORDER — OXYCODONE HCL 5 MG PO TABS: 5.0000 mg | ORAL_TABLET | Freq: Once | ORAL | Status: DC | PRN

## 2017-02-08 MED ORDER — LIDOCAINE HCL (CARDIAC) 20 MG/ML IV SOLN
INTRAVENOUS | Status: DC | PRN
Start: 2017-02-08 — End: 2017-02-08
  Administered 2017-02-08: 50 mg via INTRAVENOUS

## 2017-02-08 MED ORDER — STERILE WATER FOR IRRIGATION IR SOLN
Status: DC | PRN
  Administered 2017-02-08: 08:00:00

## 2017-02-08 MED ORDER — LACTATED RINGERS IV SOLN
INTRAVENOUS | Status: DC
  Administered 2017-02-08: 07:00:00 via INTRAVENOUS

## 2017-02-08 MED ORDER — OXYCODONE HCL 5 MG/5ML PO SOLN: 5.0000 mg | Freq: Once | ORAL | Status: DC | PRN

## 2017-02-08 SURGICAL SUPPLY — 23 items

## 2017-02-08 NOTE — Op Note (Signed)
[facility] Gastroenterology Patient Name: John Ashley Procedure Date: 02/08/2017 7:16 AM MRN: [id number] Account #: [id number] Date of Birth: 01/21/1961 Admit Type: Outpatient Age: 56 Room: [facility] OR ROOM 01 Gender: Male Note Status: Finalized Procedure:            Colonoscopy Indications:          High risk colon cancer surveillance: Personal history                        of colonic polyps Providers:            [name] MD, MD Referring MD:         Halina Maidens, MD (Referring MD) Medicines:            Propofol per Anesthesia Complications:        No immediate complications. Procedure:            Pre-Anesthesia Assessment:                       - Prior to the procedure, a History and Physical was                        performed, and patient medications and allergies were                        reviewed. The patient's tolerance of previous                        anesthesia was also reviewed. The risks and benefits of                        the procedure and the sedation options and risks were                        discussed with the patient. All questions were                        answered, and informed consent was obtained. Prior                        Anticoagulants: The patient has taken no previous                        anticoagulant or antiplatelet agents. ASA Grade                        Assessment: II - A patient with mild systemic disease.                        After reviewing the risks and benefits, the patient was                        deemed in satisfactory condition to undergo the                        procedure.                       After obtaining informed consent, the colonoscope was  passed under direct vision. Throughout the procedure,                        the patient's blood pressure, pulse, and oxygen                        saturations were monitored continuously. The Olympus   Colonoscope 190 [id number]) was introduced through the                        anus and advanced to the the cecum, identified by                        appendiceal orifice and ileocecal valve. The                        colonoscopy was performed without difficulty. The                        patient tolerated the procedure well. The quality of                        the bowel preparation was excellent. Findings:      The perianal and digital rectal examinations were normal.      A 3 mm polyp was found in the ascending colon. The polyp was sessile.       The polyp was removed with a cold biopsy forceps. Resection and       retrieval were complete.      Non-bleeding internal hemorrhoids were found during retroflexion. The       hemorrhoids were Grade II (internal hemorrhoids that prolapse but reduce       spontaneously). Impression:           - One 3 mm polyp in the ascending colon, removed with a                        cold biopsy forceps. Resected and retrieved.                       - Non-bleeding internal hemorrhoids. Recommendation:       - Discharge patient to home.                       - Resume previous diet.                       - Continue present medications.                       - Repeat colonoscopy in 5 years for surveillance. Procedure Code(s):    --- Professional ---                       [phone], Colonoscopy, flexible; with biopsy, single or                        multiple Diagnosis Code(s):    --- Professional ---                       Z86.010, Personal history of colonic polyps  D12.2, Benign neoplasm of ascending colon CPT copyright 2016 American Medical Association. All rights reserved. The codes documented in this report are preliminary and upon coder review may  be revised to meet current compliance requirements. [name] MD, MD 02/08/2017 8:11:51 AM This report has been signed electronically. Number of Addenda: 0 Note Initiated On: 02/08/2017  7:16 AM Scope Withdrawal Time: 0 hours 11 minutes 22 seconds  Total Procedure Duration: 0 hours 15 minutes 31 seconds       [facility]

## 2017-02-08 NOTE — Anesthesia Postprocedure Evaluation (Signed)
Anesthesia Post Note  Patient: John Ashley  Procedure(s) Performed: Procedure(s) (LRB): COLONOSCOPY WITH PROPOFOL (N/A) POLYPECTOMY  Patient location during evaluation: PACU Anesthesia Type: General Level of consciousness: awake and alert Pain management: pain level controlled Vital Signs Assessment: post-procedure vital signs reviewed and stable Respiratory status: spontaneous breathing Cardiovascular status: blood pressure returned to baseline Postop Assessment: no headache Anesthetic complications: no    Jaci Standard, III,  Kasey Hansell D

## 2017-02-08 NOTE — Anesthesia Preprocedure Evaluation (Signed)
Anesthesia Evaluation  Patient identified by MRN, date of birth, ID band Patient awake    Reviewed: Allergy & Precautions, H&P , NPO status , Patient's Chart, lab work & pertinent test results  Airway Mallampati: I  TM Distance: >3 FB Neck ROM: full    Dental no notable dental hx.    Pulmonary neg pulmonary ROS,    Pulmonary exam normal breath sounds clear to auscultation       Cardiovascular negative cardio ROS Normal cardiovascular exam     Neuro/Psych    GI/Hepatic Neg liver ROS, Medicated,  Endo/Other  negative endocrine ROS  Renal/GU negative Renal ROS     Musculoskeletal   Abdominal   Peds  Hematology negative hematology ROS (+)   Anesthesia Other Findings   Reproductive/Obstetrics                             Anesthesia Physical Anesthesia Plan  ASA: II  Anesthesia Plan: General   Post-op Pain Management:    Induction:   PONV Risk Score and Plan:   Airway Management Planned:   Additional Equipment:   Intra-op Plan:   Post-operative Plan:   Informed Consent:   Plan Discussed with:   Anesthesia Plan Comments:         Anesthesia Quick Evaluation

## 2017-02-08 NOTE — Transfer of Care (Signed)
Immediate Anesthesia Transfer of Care Note  Patient: John Ashley  Procedure(s) Performed: Procedure(s) with comments: COLONOSCOPY WITH PROPOFOL (N/A) - pt requests early as possible POLYPECTOMY  Patient Location: PACU  Anesthesia Type: General  Level of Consciousness: awake, alert  and patient cooperative  Airway and Oxygen Therapy: Patient Spontanous Breathing and Patient connected to supplemental oxygen  Post-op Assessment: Post-op Vital signs reviewed, Patient's Cardiovascular Status Stable, Respiratory Function Stable, Patent Airway and No signs of Nausea or vomiting  Post-op Vital Signs: Reviewed and stable  Complications: No apparent anesthesia complications

## 2017-02-08 NOTE — H&P (Signed)
   John Lame, MD [address] [place], [place] [address] [phone] Fax : [phone]  Primary Care Physician:  Glean Hess, MD Primary Gastroenterologist:  Dr. Allen Norris  Pre-Procedure History & Physical: HPI:  John Ashley is a 56 y.o. male is here for an colonoscopy.   Past Medical History:  Diagnosis Date  . Allergy   . GERD (gastroesophageal reflux disease)   . Hypoglycemia     Past Surgical History:  Procedure Laterality Date  . COLONOSCOPY  2015   repeat in 3 years d/t polyps  . perforated eardrum Right   . TONSILLECTOMY      Prior to Admission medications   Medication Sig Start Date End Date Taking? Authorizing Provider  cetirizine (ZYRTEC) 10 MG tablet Take 10 mg by mouth daily. otc    [provider]  Cholecalciferol (VITAMIN D3) 2000 units capsule Take 2,000 Units by mouth daily.    [provider]  omeprazole (PRILOSEC) 20 MG capsule Take 1 capsule (20 mg total) by mouth daily. 11/17/16   Glean Hess, MD    Allergies as of 12/30/2016  . (No Known Allergies)    Family History  Problem Relation Age of Onset  . Diabetes Mother   . Diabetes Father   . Colon cancer Father 57  . Colon cancer Brother 22  . Cancer Paternal Grandmother   . Cancer Paternal Grandfather     Social History   Social History  . Marital status: Married    Spouse name: N/A  . Number of children: N/A  . Years of education: N/A   Occupational History  . Not on file.   Social History Main Topics  . Smoking status: Never Smoker  . Smokeless tobacco: Never Used  . Alcohol use 1.2 oz/week    2 Cans of beer per week  . Drug use: No  . Sexual activity: Yes   Other Topics Concern  . Not on file   Social History Narrative  . No narrative on file    Review of Systems: See HPI, otherwise negative ROS  Physical Exam: BP 121/83   Pulse 62   Temp 97.3 F (36.3 C) (Temporal)   Resp 16   Ht 6\' 3"  (1.905 m)   Wt 173 lb  (78.5 kg)   SpO2 99%   BMI 21.62 kg/m  General:   Alert,  pleasant and cooperative in NAD Head:  Normocephalic and atraumatic. Neck:  Supple; no masses or thyromegaly. Lungs:  Clear throughout to auscultation.    Heart:  Regular rate and rhythm. Abdomen:  Soft, nontender and nondistended. Normal bowel sounds, without guarding, and without rebound.   Neurologic:  Alert and  oriented x4;  grossly normal neurologically.  Impression/Plan: Hai Grabe is here for an colonoscopy to be performed for history of colon polyps  Risks, benefits, limitations, and alternatives regarding  colonoscopy have been reviewed with the patient.  Questions have been answered.  All parties agreeable.   John Lame, MD  02/08/2017, 7:44 AM

## 2017-02-08 NOTE — Anesthesia Procedure Notes (Signed)
Performed by: Reynaldo Rossman Pre-anesthesia Checklist: Patient identified, Emergency Drugs available, Suction available, Timeout performed and Patient being monitored Patient Re-evaluated:Patient Re-evaluated prior to induction Oxygen Delivery Method: Nasal cannula Placement Confirmation: positive ETCO2       

## 2017-02-09 ENCOUNTER — Encounter: Payer: Self-pay | Admitting: Gastroenterology

## 2017-02-10 ENCOUNTER — Encounter: Payer: Self-pay | Admitting: Gastroenterology

## 2017-02-11 ENCOUNTER — Encounter: Payer: Self-pay | Admitting: Gastroenterology

## 2017-02-22 ENCOUNTER — Telehealth: Payer: Self-pay | Admitting: Gastroenterology

## 2017-02-22 ENCOUNTER — Other Ambulatory Visit: Payer: Self-pay

## 2017-02-22 NOTE — Telephone Encounter (Signed)
Pt received letter and told him his father and brother had colon cancer and he felt his repeat colonoscopy should be sooner than 5 years. I advised him that was correct and I have updated him to repeat in 3 years. I apologized stating it wasn't listed on his colonoscopy report and Dr. Allen Norris uses this information to determine repeat time.   Pt was wanting to know if this would make a difference in billing from his colonoscopy. I told him I would speak with Dr. Allen Norris to see if he could edit the billing.

## 2017-02-22 NOTE — Telephone Encounter (Signed)
Patient left a voice message that he has some questions regarding his past procedure. Please call

## 2017-02-23 NOTE — Telephone Encounter (Signed)
I am not aware of the insurance reimbursement's differing from personal history of colon polyps versus a family history of colon cancer.  This may be something that Jeral Fruit can answer for your

## 2017-04-13 ENCOUNTER — Other Ambulatory Visit: Payer: Self-pay | Admitting: Internal Medicine

## 2017-06-25 ENCOUNTER — Encounter: Payer: Self-pay | Admitting: Internal Medicine

## 2017-08-07 ENCOUNTER — Other Ambulatory Visit: Payer: Self-pay | Admitting: Internal Medicine

## 2017-08-09 NOTE — Telephone Encounter (Signed)
lvm to set up appointment.

## 2017-10-11 ENCOUNTER — Ambulatory Visit
Admission: RE | Admit: 2017-10-11 | Discharge: 2017-10-11 | Disposition: A | Discharge status: Home / Self Care | Payer: Managed Care, Other (non HMO) | Source: Ambulatory Visit | Attending: Internal Medicine | Admitting: Internal Medicine

## 2017-10-11 ENCOUNTER — Ambulatory Visit: Payer: Managed Care, Other (non HMO) | Admitting: Internal Medicine

## 2017-10-11 ENCOUNTER — Encounter: Payer: Self-pay | Admitting: Internal Medicine

## 2017-10-11 VITALS — BP 122/80 | HR 66 | Ht 75.0 in | Wt 185.0 lb

## 2017-10-11 DIAGNOSIS — G8929 Other chronic pain: Secondary | ICD-10-CM

## 2017-10-11 DIAGNOSIS — M5441 Lumbago with sciatica, right side: Secondary | ICD-10-CM

## 2017-10-11 DIAGNOSIS — M5137 Other intervertebral disc degeneration, lumbosacral region: Secondary | ICD-10-CM | POA: Diagnosis not present

## 2017-10-11 MED ORDER — METHOCARBAMOL 500 MG PO TABS: 500.0000 mg | ORAL_TABLET | Freq: Every day | ORAL | 0 refills | Status: DC

## 2017-10-11 NOTE — Progress Notes (Signed)
Date:  10/11/2017   Name:  John Ashley   DOB:  10-22-1960   MRN:  [id number]   Chief Complaint: Back Pain (Lower back pain- Always had touble with back. Hurt himself over a year ago while on the ladder and building a building. Right leg was number for months after injury. Can't bend over. Work is harder on back. )  Back Pain  This is a chronic problem. The current episode started more than 1 year ago. The problem occurs daily. The problem has been gradually worsening since onset. The pain is present in the lumbar spine. The quality of the pain is described as aching and cramping. The pain radiates to the right foot. The pain is moderate. The symptoms are aggravated by bending, standing, twisting and stress. Associated symptoms include numbness and paresthesias. Pertinent negatives include no bladder incontinence, bowel incontinence, chest pain, fever, headaches, perianal numbness or weakness. He has tried chiropractic manipulation and NSAIDs for the symptoms. The treatment provided mild relief.  He reports back pain in the lumbar reason for years.  He uses a Restaurant manager, fast food and takes Aleve as needed.  A year ago the pain seemed to get worse and he had numbness in his right leg and foot for 2 months.  That has since resolved.    Review of Systems  Constitutional: Negative for chills, fatigue and fever.  Respiratory: Negative for chest tightness and shortness of breath.   Cardiovascular: Negative for chest pain and leg swelling.  Gastrointestinal: Negative for bowel incontinence.  Genitourinary: Negative for bladder incontinence and difficulty urinating.  Musculoskeletal: Positive for back pain and gait problem.  Skin: Negative for rash.  Neurological: Positive for numbness and paresthesias. Negative for dizziness, weakness and headaches.  Psychiatric/Behavioral: Negative for dysphoric mood and sleep disturbance.    Patient Active Problem List   Diagnosis Date Noted  . Enlarged prostate on  rectal examination 12/21/2016  . GERD (gastroesophageal reflux disease) 01/08/2016  . Allergic rhinitis 01/08/2016  . Colon polyp, hyperplastic 12/10/2015  . Vitamin D deficiency 12/10/2015  . Reactive hypoglycemia 12/09/2015  . Bilateral inguinal hernia 12/09/2015    Prior to Admission medications   Medication Sig Start Date End Date Taking? Authorizing Provider  cetirizine (ZYRTEC) 10 MG tablet Take 10 mg by mouth daily. otc   Yes [provider]  Cholecalciferol (VITAMIN D3) 2000 units capsule Take 2,000 Units by mouth daily.   Yes [provider]  omeprazole (PRILOSEC) 20 MG capsule TAKE ONE CAPSULE BY MOUTH DAILY 08/07/17  Yes Glean Hess, MD    No Known Allergies  Past Surgical History:  Procedure Laterality Date  . COLONOSCOPY  2015   repeat in 3 years d/t polyps  . COLONOSCOPY WITH PROPOFOL N/A 02/08/2017   Procedure: COLONOSCOPY WITH PROPOFOL;  Surgeon: [name], MD;  Location: [place];  Service: Endoscopy;  Laterality: N/A;  pt requests early as possible  . perforated eardrum Right   . POLYPECTOMY  02/08/2017   Procedure: POLYPECTOMY;  Surgeon: [name], MD;  Location: [place];  Service: Endoscopy;;  . TONSILLECTOMY      Social History   Tobacco Use  . Smoking status: Never Smoker  . Smokeless tobacco: Never Used  Substance Use Topics  . Alcohol use: Yes    Alcohol/week: 1.2 oz    Types: 2 Cans of beer per week  . Drug use: No     Medication list has been reviewed and updated.  PHQ 2/9 Scores 10/11/2017  10/11/2017 12/09/2015  PHQ - 2 Score 0 0 0  PHQ- 9 Score 0 - -    Physical Exam  Constitutional: He is oriented to person, place, and time. He appears well-developed. No distress.  HENT:  Head: Normocephalic and atraumatic.  Cardiovascular: Normal rate, regular rhythm and normal heart sounds.  Pulmonary/Chest: Effort normal and breath sounds normal. No respiratory distress. He has no wheezes.    Musculoskeletal: He exhibits no edema.       Right hip: Normal.       Left hip: Normal.       Lumbar back: He exhibits decreased range of motion, tenderness and spasm. He exhibits no bony tenderness.  Neurological: He is alert and oriented to person, place, and time. He has normal strength and normal reflexes. No sensory deficit.  Skin: Skin is warm and dry. No rash noted.  Psychiatric: He has a normal mood and affect. His behavior is normal. Thought content normal.  Nursing note and vitals reviewed.   BP 122/80   Pulse 66   Ht 6\' 3"  (1.905 m)   Wt 185 lb (83.9 kg)   SpO2 99%   BMI 23.12 kg/m   Assessment and Plan: 1. Chronic midline low back pain with right-sided sciatica Will get plain films, add robaxin and refer to Ortho - DG Lumbar Spine Complete; Future - Ambulatory referral to Orthopedic Surgery - methocarbamol (ROBAXIN) 500 MG tablet; Take 1 tablet (500 mg total) by mouth at bedtime.  Dispense: 30 tablet; Refill: 0   Meds ordered this encounter  Medications  . methocarbamol (ROBAXIN) 500 MG tablet    Sig: Take 1 tablet (500 mg total) by mouth at bedtime.    Dispense:  30 tablet    Refill:  0    Partially dictated using Editor, commissioning. Any errors are unintentional.  Halina Maidens, MD [facility]  10/11/2017

## 2017-10-22 DIAGNOSIS — M5136 Other intervertebral disc degeneration, lumbar region: Secondary | ICD-10-CM | POA: Insufficient documentation

## 2017-12-13 ENCOUNTER — Other Ambulatory Visit: Payer: Self-pay | Admitting: Internal Medicine

## 2017-12-13 ENCOUNTER — Encounter: Payer: Self-pay | Admitting: Internal Medicine

## 2017-12-13 MED ORDER — MELOXICAM 15 MG PO TABS: 15.0000 mg | ORAL_TABLET | Freq: Every day | ORAL | 2 refills | Status: DC

## 2017-12-13 NOTE — Telephone Encounter (Signed)
Requesting a refill. Please Advise patient request.

## 2018-01-04 ENCOUNTER — Encounter: Payer: Managed Care, Other (non HMO) | Admitting: Internal Medicine

## 2018-02-03 ENCOUNTER — Other Ambulatory Visit: Payer: Self-pay | Admitting: Internal Medicine

## 2018-03-20 ENCOUNTER — Encounter: Payer: Self-pay | Admitting: Internal Medicine

## 2018-03-21 ENCOUNTER — Other Ambulatory Visit: Payer: Self-pay

## 2018-03-21 MED ORDER — MELOXICAM 15 MG PO TABS: 15.0000 mg | ORAL_TABLET | Freq: Every day | ORAL | 2 refills | Status: DC

## 2018-05-10 ENCOUNTER — Ambulatory Visit (INDEPENDENT_AMBULATORY_CARE_PROVIDER_SITE_OTHER): Payer: Managed Care, Other (non HMO) | Admitting: Internal Medicine

## 2018-05-10 ENCOUNTER — Encounter: Payer: Self-pay | Admitting: Internal Medicine

## 2018-05-10 VITALS — BP 112/76 | HR 64 | Resp 16 | Ht 75.0 in | Wt 180.0 lb

## 2018-05-10 DIAGNOSIS — Z0000 Encounter for general adult medical examination without abnormal findings: Secondary | ICD-10-CM | POA: Diagnosis not present

## 2018-05-10 DIAGNOSIS — E559 Vitamin D deficiency, unspecified: Secondary | ICD-10-CM

## 2018-05-10 DIAGNOSIS — M5136 Other intervertebral disc degeneration, lumbar region: Secondary | ICD-10-CM

## 2018-05-10 DIAGNOSIS — K219 Gastro-esophageal reflux disease without esophagitis: Secondary | ICD-10-CM

## 2018-05-10 DIAGNOSIS — N400 Enlarged prostate without lower urinary tract symptoms: Secondary | ICD-10-CM

## 2018-05-10 DIAGNOSIS — Z125 Encounter for screening for malignant neoplasm of prostate: Secondary | ICD-10-CM | POA: Diagnosis not present

## 2018-05-10 LAB — POCT URINALYSIS DIPSTICK
Bilirubin, UA: NEGATIVE
Blood, UA: NEGATIVE
GLUCOSE UA: NEGATIVE
Ketones, UA: NEGATIVE
Leukocytes, UA: NEGATIVE
NITRITE UA: NEGATIVE
PROTEIN UA: NEGATIVE
Spec Grav, UA: 1.02 (ref 1.010–1.025)
Urobilinogen, UA: 0.2 E.U./dL
pH, UA: 6.5 (ref 5.0–8.0)

## 2018-05-10 NOTE — Patient Instructions (Addendum)
Recommend Dermatology Skin Survey - schedule an appt  Tylenol XS 500 mg 2 tablets twice a day

## 2018-05-10 NOTE — Progress Notes (Signed)
Date:  05/10/2018   Name:  John Ashley   DOB:  19-Dec-1960   MRN:  [id number]   Chief Complaint: Annual Exam John Ashley is a 57 y.o. male who presents today for his Complete Annual Exam. He feels fairly well. He reports exercising PT.  He reports he is sleeping well. He is having more back problems that are preventing him from exercising.  Back Pain  This is a chronic problem. The problem occurs daily. The problem is unchanged. The pain is present in the lumbar spine. The symptoms are aggravated by bending and twisting. Pertinent negatives include no abdominal pain, bladder incontinence, bowel incontinence, chest pain, dysuria, headaches, paresthesias or perianal numbness. He has tried muscle relaxant and NSAIDs (but relief is not lasting.  Mobic helped but irritated his stomach.) for the symptoms.  Gastroesophageal Reflux  He reports no abdominal pain, no chest pain, no choking or no wheezing. Pertinent negatives include no fatigue. He has tried a PPI for the symptoms. The treatment provided significant relief.     Review of Systems  Constitutional: Negative for appetite change, chills, diaphoresis, fatigue and unexpected weight change.  HENT: Negative for hearing loss, tinnitus, trouble swallowing and voice change.   Eyes: Positive for visual disturbance (double vision).  Respiratory: Negative for choking, shortness of breath and wheezing.   Cardiovascular: Negative for chest pain, palpitations and leg swelling.  Gastrointestinal: Negative for abdominal pain, blood in stool, bowel incontinence, constipation and diarrhea.  Genitourinary: Negative for bladder incontinence, difficulty urinating, dysuria, frequency and urgency.       Nocturia   Musculoskeletal: Positive for back pain. Negative for arthralgias and myalgias.  Skin: Negative for color change and rash.  Allergic/Immunologic: Positive for environmental allergies.  Neurological: Negative for dizziness, syncope, headaches and  paresthesias.  Hematological: Negative for adenopathy.  Psychiatric/Behavioral: Negative for dysphoric mood and sleep disturbance.    Patient Active Problem List   Diagnosis Date Noted  . DDD (degenerative disc disease), lumbar 10/22/2017  . Enlarged prostate on rectal examination 12/21/2016  . GERD (gastroesophageal reflux disease) 01/08/2016  . Allergic rhinitis 01/08/2016  . Colon polyp, hyperplastic 12/10/2015  . Vitamin D deficiency 12/10/2015  . Reactive hypoglycemia 12/09/2015  . Bilateral inguinal hernia 12/09/2015    No Known Allergies  Past Surgical History:  Procedure Laterality Date  . COLONOSCOPY  2015   repeat in 3 years d/t polyps  . COLONOSCOPY WITH PROPOFOL N/A 02/08/2017   Procedure: COLONOSCOPY WITH PROPOFOL;  Surgeon: [name], MD;  Location: [facility];  Service: Endoscopy;  Laterality: N/A;  pt requests early as possible  . perforated eardrum Right   . POLYPECTOMY  02/08/2017   Procedure: POLYPECTOMY;  Surgeon: [name], MD;  Location: [facility];  Service: Endoscopy;;  . TONSILLECTOMY      Social History   Tobacco Use  . Smoking status: Never Smoker  . Smokeless tobacco: Never Used  Substance Use Topics  . Alcohol use: Yes    Alcohol/week: 2.0 standard drinks    Types: 2 Cans of beer per week  . Drug use: No     Medication list has been reviewed and updated.  Current Meds  Medication Sig  . cetirizine (ZYRTEC) 10 MG tablet Take 10 mg by mouth daily. otc  . Cholecalciferol (VITAMIN D3) 2000 units capsule Take 2,000 Units by mouth daily.  Marland Kitchen omeprazole (PRILOSEC) 20 MG capsule TAKE ONE CAPSULE BY MOUTH DAILY    PHQ 2/9 Scores 05/10/2018 10/11/2017 10/11/2017 12/09/2015  PHQ - 2 Score 0 0 0 0  PHQ- 9 Score 0 0 - -    Physical Exam  Constitutional: He is oriented to person, place, and time. He appears well-developed and well-nourished.  HENT:  Head: Normocephalic.  Right Ear: Tympanic membrane, external ear and ear  canal normal.  Left Ear: Tympanic membrane, external ear and ear canal normal.  Nose: Nose normal.  Mouth/Throat: Uvula is midline and oropharynx is clear and moist.  Eyes: Pupils are equal, round, and reactive to light. Conjunctivae and EOM are normal.  Neck: Normal range of motion. Neck supple. Carotid bruit is not present. No thyromegaly present.  Cardiovascular: Normal rate, regular rhythm, normal heart sounds and intact distal pulses.  Pulmonary/Chest: Effort normal and breath sounds normal. He has no wheezes. Right breast exhibits no mass. Left breast exhibits no mass.  Abdominal: Soft. Normal appearance and bowel sounds are normal. There is no hepatosplenomegaly. There is no tenderness.  Musculoskeletal: He exhibits tenderness. He exhibits no edema.       Lumbar back: He exhibits decreased range of motion, tenderness and spasm.  Lymphadenopathy:    He has no cervical adenopathy.  Neurological: He is alert and oriented to person, place, and time. He has normal strength and normal reflexes. No cranial nerve deficit or sensory deficit. He displays a negative Romberg sign.  Skin: Skin is warm, dry and intact.  Psychiatric: He has a normal mood and affect. His speech is normal and behavior is normal. Judgment and thought content normal.  Nursing note and vitals reviewed.   BP 112/76   Pulse 64   Resp 16   Ht 6\' 3"  (1.905 m)   Wt 180 lb (81.6 kg)   SpO2 98%   BMI 22.50 kg/m   Assessment and Plan: 1. Annual physical exam Continue healthy diet Needs to exercise but limited by back pain - Comprehensive metabolic panel - Lipid panel - POCT urinalysis dipstick  2. Prostate cancer screening DRE deferred - PSA  3. Gastroesophageal reflux disease, esophagitis presence not specified Continue PPI - CBC with Differential/Platelet  4. DDD (degenerative disc disease), lumbar Try Tylenol XS bid Follow up with Ortho  5. Enlarged prostate on rectal examination - PSA  6. Vitamin D  deficiency supplementing - VITAMIN D 25 Hydroxy (Vit-D Deficiency, Fractures)   No orders of the defined types were placed in this encounter.   Partially dictated using Editor, commissioning. Any errors are unintentional.  Halina Maidens, MD [facility]  05/10/2018

## 2018-05-11 LAB — COMPREHENSIVE METABOLIC PANEL
A/G RATIO: 2 (ref 1.2–2.2)
ALBUMIN: 4.9 g/dL (ref 3.5–5.5)
ALK PHOS: 61 IU/L (ref 39–117)
ALT: 21 IU/L (ref 0–44)
AST: 22 IU/L (ref 0–40)
BILIRUBIN TOTAL: 0.8 mg/dL (ref 0.0–1.2)
BUN / CREAT RATIO: 26 — AB (ref 9–20)
BUN: 23 mg/dL (ref 6–24)
CO2: 23 mmol/L (ref 20–29)
CREATININE: 0.9 mg/dL (ref 0.76–1.27)
Calcium: 9.3 mg/dL (ref 8.7–10.2)
Chloride: 101 mmol/L (ref 96–106)
GFR calc Af Amer: 108 mL/min/{1.73_m2} (ref >59)
GFR calc non Af Amer: 94 mL/min/{1.73_m2} (ref >59)
GLOBULIN, TOTAL: 2.5 g/dL (ref 1.5–4.5)
Glucose: 75 mg/dL (ref 65–99)
POTASSIUM: 4.3 mmol/L (ref 3.5–5.2)
SODIUM: 142 mmol/L (ref 134–144)
Total Protein: 7.4 g/dL (ref 6.0–8.5)

## 2018-05-11 LAB — CBC WITH DIFFERENTIAL/PLATELET
BASOS ABS: 0 10*3/uL (ref 0.0–0.2)
Basos: 1 %
EOS (ABSOLUTE): 0.1 10*3/uL (ref 0.0–0.4)
EOS: 2 %
HEMATOCRIT: 45.8 % (ref 37.5–51.0)
Hemoglobin: 15.6 g/dL (ref 13.0–17.7)
Immature Grans (Abs): 0 10*3/uL (ref 0.0–0.1)
Immature Granulocytes: 0 %
LYMPHS ABS: 1.4 10*3/uL (ref 0.7–3.1)
Lymphs: 28 %
MCH: 30.9 pg (ref 26.6–33.0)
MCHC: 34.1 g/dL (ref 31.5–35.7)
MCV: 91 fL (ref 79–97)
MONOCYTES: 9 %
Monocytes Absolute: 0.5 10*3/uL (ref 0.1–0.9)
Neutrophils Absolute: 3 10*3/uL (ref 1.4–7.0)
Neutrophils: 60 %
Platelets: 217 10*3/uL (ref 150–450)
RBC: 5.05 x10E6/uL (ref 4.14–5.80)
RDW: 12.9 % (ref 12.3–15.4)
WBC: 5 10*3/uL (ref 3.4–10.8)

## 2018-05-11 LAB — PSA: Prostate Specific Ag, Serum: 2 ng/mL (ref 0.0–4.0)

## 2018-05-11 LAB — LIPID PANEL
Chol/HDL Ratio: 3.7 ratio (ref 0.0–5.0)
Cholesterol, Total: 156 mg/dL (ref 100–199)
HDL: 42 mg/dL (ref >39)
LDL CALC: 86 mg/dL (ref 0–99)
Triglycerides: 138 mg/dL (ref 0–149)
VLDL Cholesterol Cal: 28 mg/dL (ref 5–40)

## 2018-05-11 LAB — VITAMIN D 25 HYDROXY (VIT D DEFICIENCY, FRACTURES): VIT D 25 HYDROXY: 48.9 ng/mL (ref 30.0–100.0)

## 2018-07-07 ENCOUNTER — Other Ambulatory Visit: Payer: Self-pay | Admitting: Orthopedic Surgery

## 2018-07-07 DIAGNOSIS — M545 Low back pain, unspecified: Secondary | ICD-10-CM

## 2018-07-07 DIAGNOSIS — G8929 Other chronic pain: Secondary | ICD-10-CM

## 2018-07-07 DIAGNOSIS — M5136 Other intervertebral disc degeneration, lumbar region: Secondary | ICD-10-CM

## 2018-07-27 ENCOUNTER — Ambulatory Visit: Payer: Managed Care, Other (non HMO)

## 2018-09-09 ENCOUNTER — Other Ambulatory Visit: Payer: Self-pay | Admitting: Internal Medicine

## 2018-10-12 ENCOUNTER — Telehealth: Payer: Self-pay | Admitting: Gastroenterology

## 2018-10-12 NOTE — Telephone Encounter (Signed)
Contacted pt and advised him per Dr. Allen Norris the new recommendation for a repeat colonoscopy due to family hx is now 5 years. I advised pt since we originally told him 3 years (due to previous recommendation) we will still keep the colonoscopy repeat for 3 years. He will be due next year, 2021.

## 2018-10-12 NOTE — Telephone Encounter (Signed)
Patient called & states he is to have colonoscopies every 2 yrs due to his father having colon cancer @ age 60,brother died @ 49 with colon cancer. The documentation that his primary care received shows he due every 5 years & he would like this corrected. He is currently in recalls for 02-2020 for his next colonoscopy (70yrs from last one). He states he's due for one on 01-2019 this would be the 2 nd  year [facility].

## 2019-02-24 IMAGING — CR DG LUMBAR SPINE COMPLETE 4+V
5 series · 5 of 5 positions shown · non-contrast
Comparison: None.

CLINICAL DATA: Chronic lower back pain.  Right-sided sciatica.

EXAM:
LUMBAR SPINE - COMPLETE 4+ VIEW

[l-spine ap]
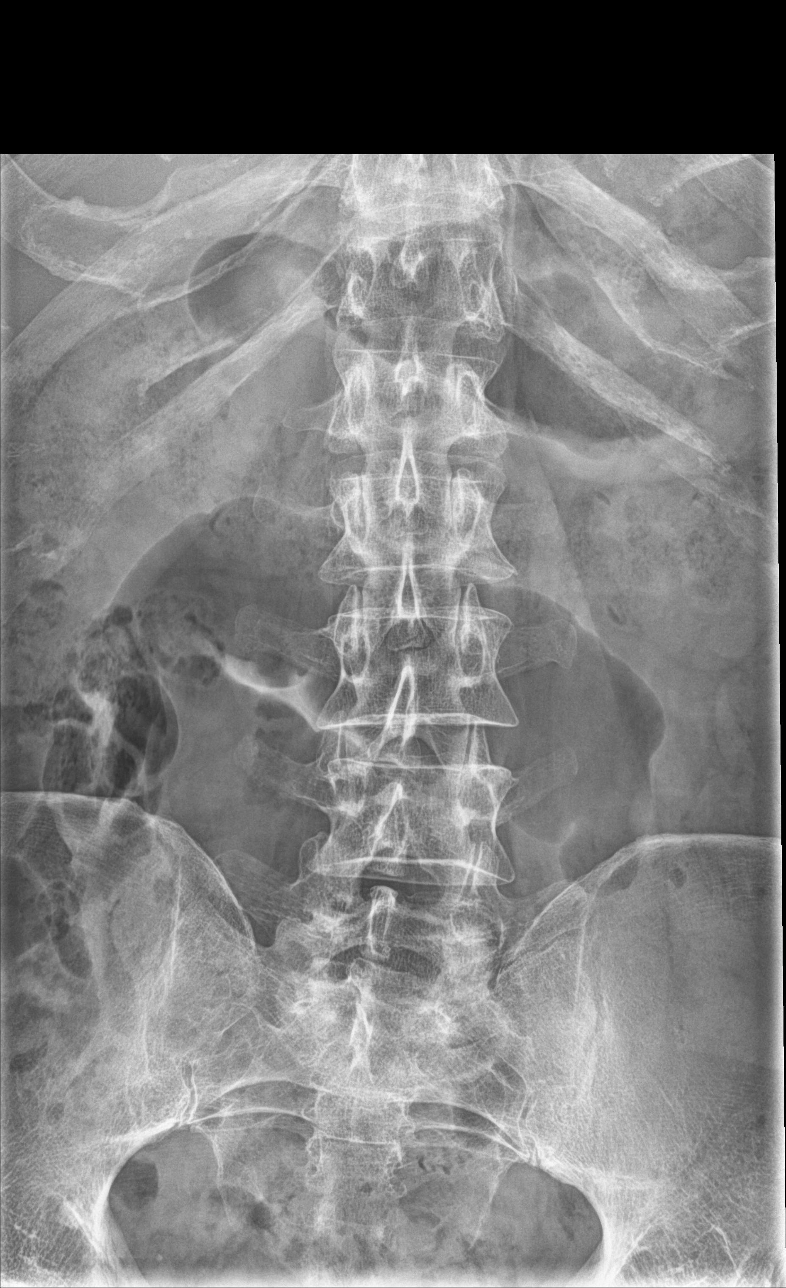

[l-spine obl (1 of 2)]
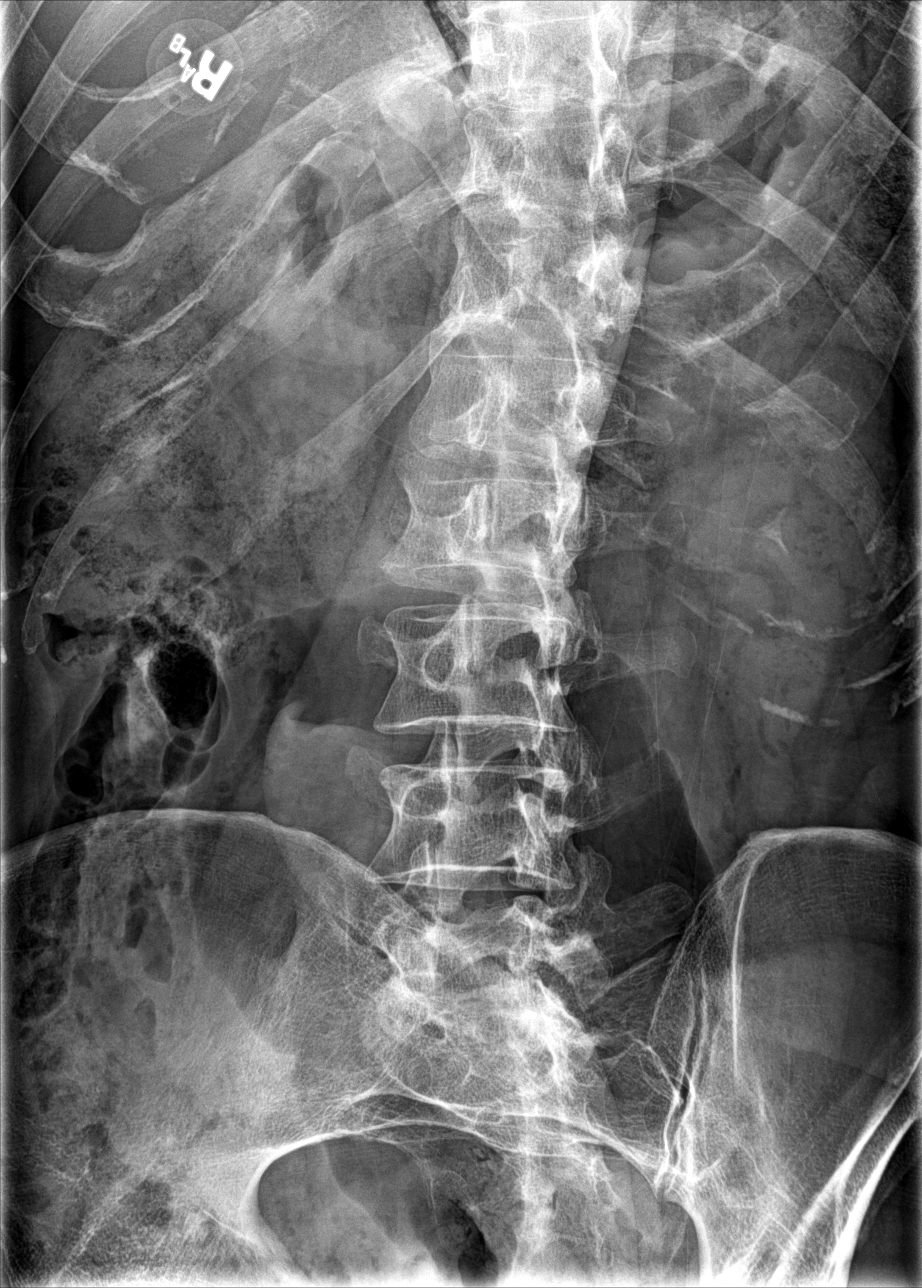

[l-spine obl (2 of 2)]
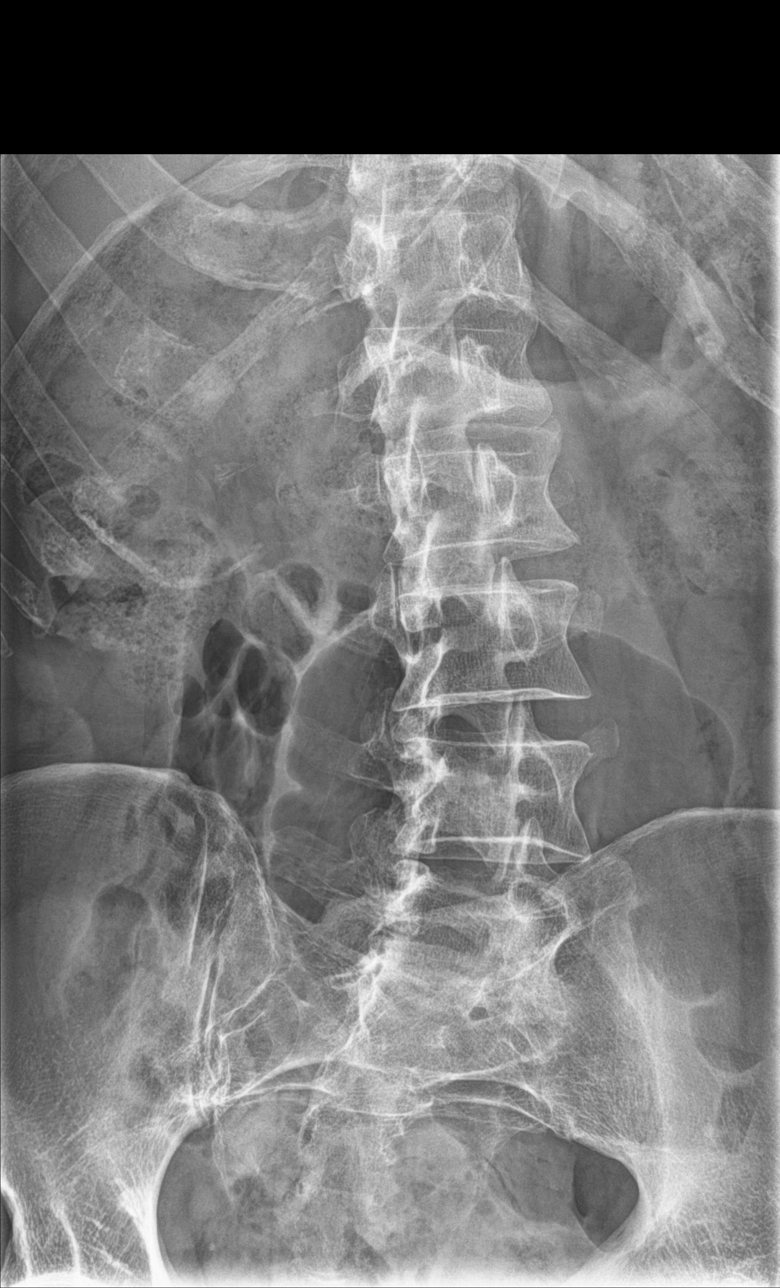

[l-spine lat]
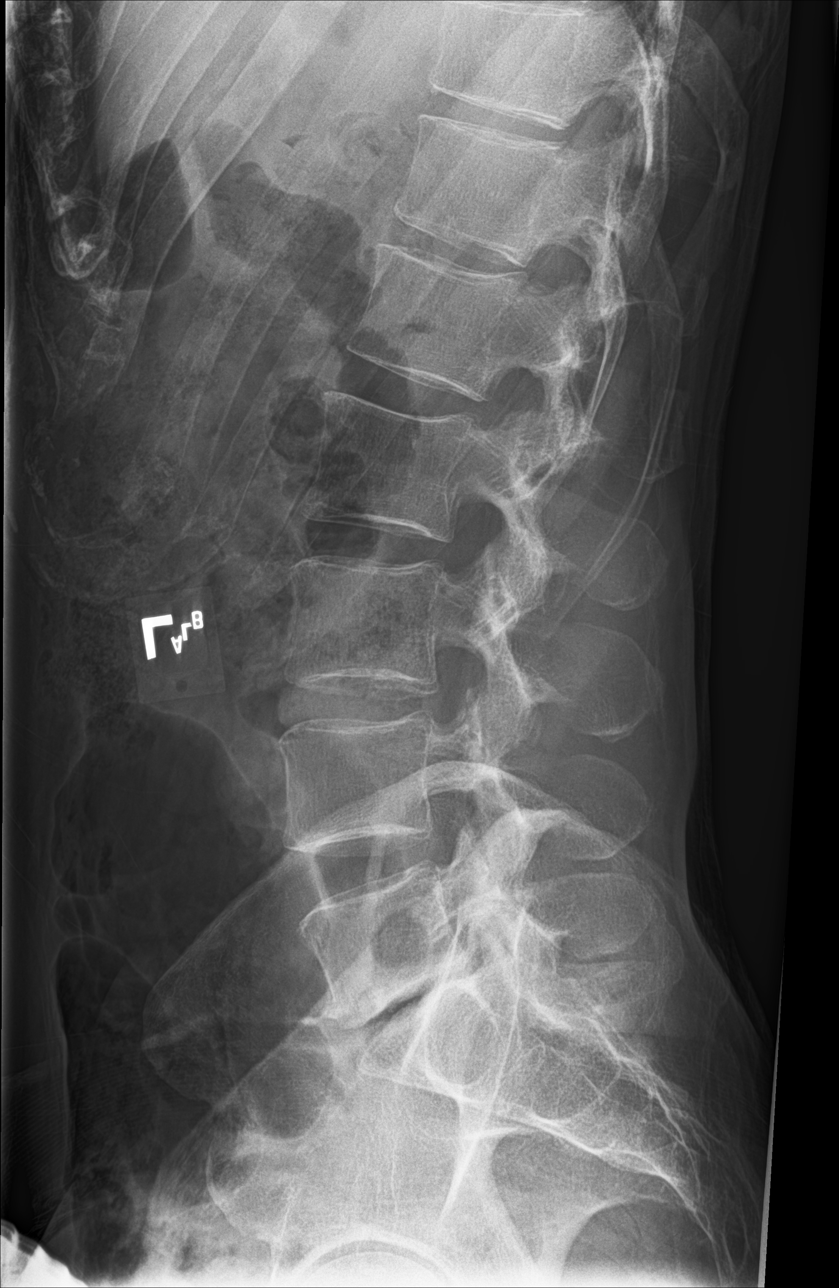

[l-spine spot]
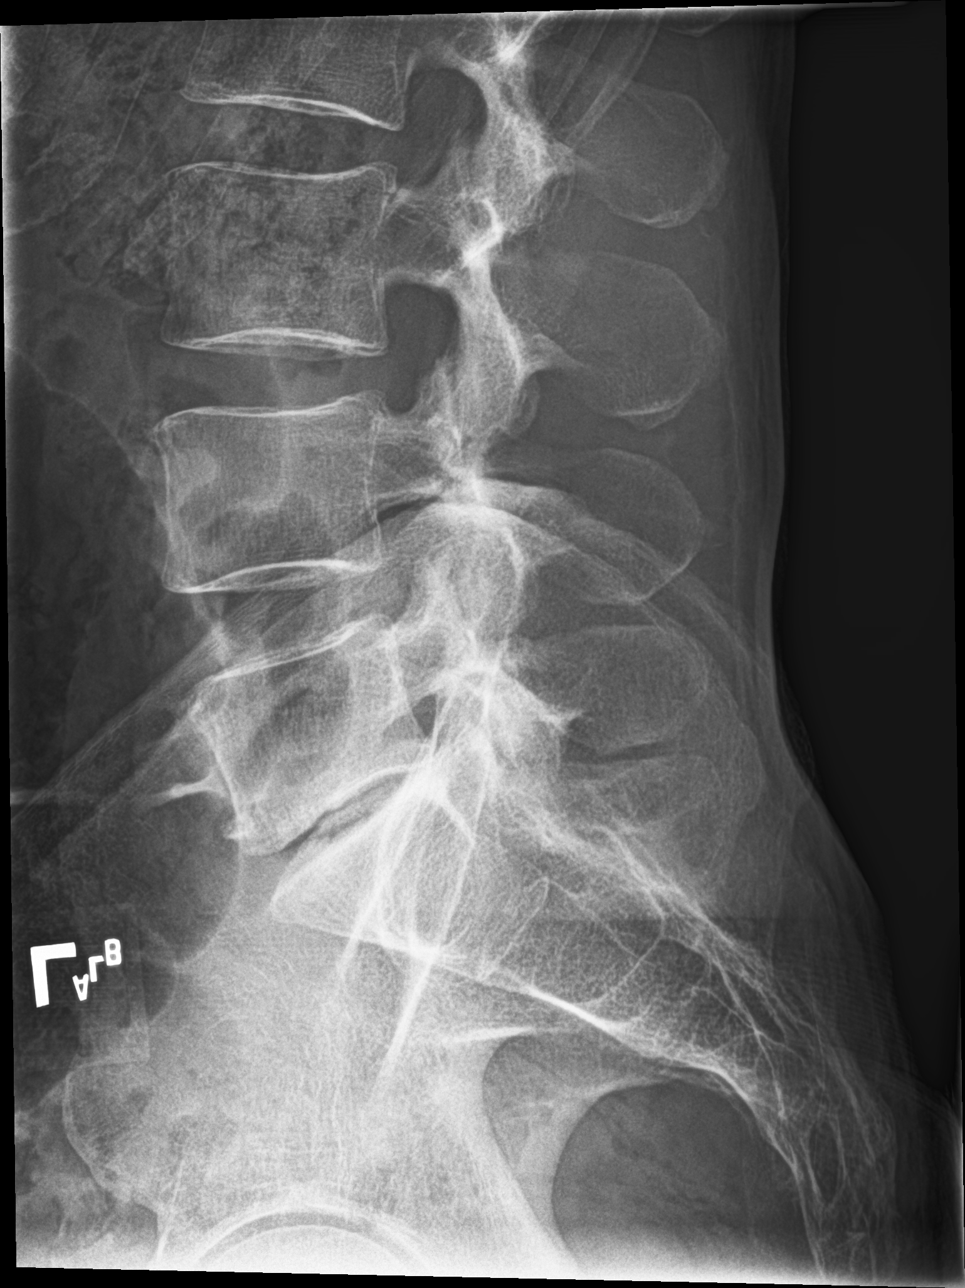

[5 of 5 positions shown; findings below may reference images not displayed]

FINDINGS: No fracture or spondylolisthesis is noted. Moderate degenerative
disc disease is noted at L5-S1. Remaining disc spaces appear intact.
Posterior facet joints are unremarkable.
IMPRESSION: Moderate degenerative disc disease is noted at L5-S1. No acute
abnormality seen in the lumbar spine.

## 2019-05-15 ENCOUNTER — Other Ambulatory Visit: Payer: Self-pay

## 2019-05-15 ENCOUNTER — Ambulatory Visit (INDEPENDENT_AMBULATORY_CARE_PROVIDER_SITE_OTHER): Payer: Managed Care, Other (non HMO) | Admitting: Internal Medicine

## 2019-05-15 ENCOUNTER — Encounter: Payer: Self-pay | Admitting: Internal Medicine

## 2019-05-15 VITALS — BP 118/78 | HR 52 | Ht 75.0 in | Wt 181.0 lb

## 2019-05-15 DIAGNOSIS — Z0000 Encounter for general adult medical examination without abnormal findings: Secondary | ICD-10-CM

## 2019-05-15 DIAGNOSIS — M51369 Other intervertebral disc degeneration, lumbar region without mention of lumbar back pain or lower extremity pain: Secondary | ICD-10-CM

## 2019-05-15 DIAGNOSIS — M5136 Other intervertebral disc degeneration, lumbar region: Secondary | ICD-10-CM

## 2019-05-15 DIAGNOSIS — K219 Gastro-esophageal reflux disease without esophagitis: Secondary | ICD-10-CM | POA: Diagnosis not present

## 2019-05-15 DIAGNOSIS — Z125 Encounter for screening for malignant neoplasm of prostate: Secondary | ICD-10-CM

## 2019-05-15 LAB — POCT URINALYSIS DIPSTICK
Bilirubin, UA: NEGATIVE
Blood, UA: NEGATIVE
Glucose, UA: NEGATIVE
Ketones, UA: NEGATIVE
Leukocytes, UA: NEGATIVE
Nitrite, UA: NEGATIVE
Protein, UA: NEGATIVE
Spec Grav, UA: 1.02 (ref 1.010–1.025)
Urobilinogen, UA: 0.2 E.U./dL
pH, UA: 5 (ref 5.0–8.0)

## 2019-05-15 NOTE — Progress Notes (Signed)
Date:  05/15/2019   Name:  John Ashley   DOB:  07-Oct-1960   MRN:  [id number]   Chief Complaint: Annual Exam John Ashley is a 58 y.o. male who presents today for his Complete Annual Exam. He feels well. 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 He reports he is sleeping fairly well.   Colonoscopy  01/2017 - repeat 3 yrs Tdap and Flu vaccines through employer  Gastroesophageal Reflux He reports no abdominal pain, no chest pain, no choking, no heartburn, no water brash or no wheezing. This is a recurrent problem. The problem occurs rarely. Pertinent negatives include no fatigue. He has tried a PPI for the symptoms. The treatment provided significant relief.  Back Pain This is a chronic problem. The problem occurs daily. The pain is present in the lumbar spine. Pertinent negatives include no abdominal pain, chest pain, dysuria, headaches, numbness or weakness. He has tried home exercises, muscle relaxant and NSAIDs (ESI - for lumbar DDD) for the symptoms. The treatment provided mild (Advil helps the most) relief.    Review of Systems  Constitutional: Negative for appetite change, chills, diaphoresis, fatigue and unexpected weight change.  HENT: Negative for hearing loss, tinnitus, trouble swallowing and voice change.   Eyes: Negative for visual disturbance.  Respiratory: Negative for choking, shortness of breath and wheezing.   Cardiovascular: Negative for chest pain, palpitations and leg swelling.  Gastrointestinal: Negative for abdominal pain, blood in stool, constipation, diarrhea and heartburn.  Genitourinary: Negative for difficulty urinating, dysuria and frequency.  Musculoskeletal: Positive for back pain. Negative for arthralgias, gait problem, joint swelling and myalgias.  Skin: Negative for color change and rash.  Allergic/Immunologic: Negative for environmental allergies.  Neurological: Negative for dizziness, syncope, weakness, numbness and headaches.   Hematological: Negative for adenopathy.  Psychiatric/Behavioral: Negative for dysphoric mood and sleep disturbance. The patient is not nervous/anxious.     Patient Active Problem List   Diagnosis Date Noted  . DDD (degenerative disc disease), lumbar 10/22/2017  . Enlarged prostate on rectal examination 12/21/2016  . GERD (gastroesophageal reflux disease) 01/08/2016  . Allergic rhinitis 01/08/2016  . Colon polyp, hyperplastic 12/10/2015  . Vitamin D deficiency 12/10/2015  . Reactive hypoglycemia 12/09/2015  . Bilateral inguinal hernia 12/09/2015    No Known Allergies  Past Surgical History:  Procedure Laterality Date  . COLONOSCOPY  2015   repeat in 3 years d/t polyps  . COLONOSCOPY WITH PROPOFOL N/A 02/08/2017   Procedure: COLONOSCOPY WITH PROPOFOL;  Surgeon: [name], MD;  Location: [facility];  Service: Endoscopy;  Laterality: N/A;  pt requests early as possible  . perforated eardrum Right   . POLYPECTOMY  02/08/2017   Procedure: POLYPECTOMY;  Surgeon: [name], MD;  Location: [facility];  Service: Endoscopy;;  . TONSILLECTOMY      Social History   Tobacco Use  . Smoking status: Never Smoker  . Smokeless tobacco: Never Used  Substance Use Topics  . Alcohol use: Yes    Alcohol/week: 2.0 standard drinks    Types: 2 Cans of beer per week  . Drug use: No     Medication list has been reviewed and updated.  Current Meds  Medication Sig  . cetirizine (ZYRTEC) 10 MG tablet Take 10 mg by mouth daily. otc  . Cholecalciferol (VITAMIN D3) 2000 units capsule Take 2,000 Units by mouth daily.  Marland Kitchen omeprazole (PRILOSEC) 20 MG capsule TAKE ONE CAPSULE BY MOUTH DAILY    PHQ 2/9 Scores 05/15/2019 05/10/2018  10/11/2017 10/11/2017  PHQ - 2 Score 0 0 0 0  PHQ- 9 Score - 0 0 -    BP Readings from Last 3 Encounters:  05/15/19 118/78  05/10/18 112/76  10/11/17 122/80    Physical Exam Vitals signs and nursing note reviewed.  Constitutional:       Appearance: Normal appearance. He is well-developed.  HENT:     Head: Normocephalic.     Right Ear: Tympanic membrane, ear canal and external ear normal.     Left Ear: Tympanic membrane, ear canal and external ear normal.     Nose: Nose normal.     Mouth/Throat:     Pharynx: Uvula midline.  Eyes:     Conjunctiva/sclera: Conjunctivae normal.     Pupils: Pupils are equal, round, and reactive to light.  Neck:     Musculoskeletal: Normal range of motion and neck supple.     Thyroid: No thyromegaly.     Vascular: No carotid bruit.  Cardiovascular:     Rate and Rhythm: Normal rate and regular rhythm.     Heart sounds: Normal heart sounds.  Pulmonary:     Effort: Pulmonary effort is normal.     Breath sounds: Normal breath sounds. No wheezing.  Chest:     Breasts:        Right: No mass.        Left: No mass.  Abdominal:     General: Bowel sounds are normal.     Palpations: Abdomen is soft.     Tenderness: There is no abdominal tenderness.  Musculoskeletal:     Lumbar back: He exhibits decreased range of motion.     Right lower leg: No edema.     Left lower leg: No edema.  Lymphadenopathy:     Cervical: No cervical adenopathy.  Skin:    General: Skin is warm and dry.     Capillary Refill: Capillary refill takes less than 2 seconds.  Neurological:     General: No focal deficit present.     Mental Status: He is alert and oriented to person, place, and time.     Deep Tendon Reflexes: Reflexes are normal and symmetric.     Reflex Scores:      Bicep reflexes are 2+ on the right side and 2+ on the left side.      Patellar reflexes are 2+ on the right side and 2+ on the left side. Psychiatric:        Speech: Speech normal.        Behavior: Behavior normal.        Thought Content: Thought content normal.        Judgment: Judgment normal.     Wt Readings from Last 3 Encounters:  05/15/19 181 lb (82.1 kg)  05/10/18 180 lb (81.6 kg)  10/11/17 185 lb (83.9 kg)    BP 118/78    Pulse (!) 52   Ht 6\' 3"  (1.905 m)   Wt 181 lb (82.1 kg)   SpO2 98%   BMI 22.62 kg/m   Assessment and Plan: 1. Annual physical exam Normal exam Continue healthy diet, regular exercise, normal weight - Comprehensive metabolic panel - Lipid panel - POCT urinalysis dipstick  2. Prostate cancer screening DRE deferred to lack of sx - PSA  3. Gastroesophageal reflux disease, esophagitis presence not specified Symptoms well controlled on daily PPI No red flag signs such as weight loss, n/v, melena Will continue omeprazole daily. - CBC with Differential/Platelet  4.  DDD (degenerative disc disease), lumbar Followed by Ortho - failed conservative therapy with PT and ESI Taking Advil daily with fair control of symtpoms   Partially dictated using Editor, commissioning. Any errors are unintentional.  Halina Maidens, MD [facility]  05/15/2019

## 2019-05-16 LAB — COMPREHENSIVE METABOLIC PANEL
ALT: 22 IU/L (ref 0–44)
AST: 19 IU/L (ref 0–40)
Albumin/Globulin Ratio: 2.1 (ref 1.2–2.2)
Albumin: 4.7 g/dL (ref 3.8–4.9)
Alkaline Phosphatase: 64 IU/L (ref 39–117)
BUN/Creatinine Ratio: 27 — ABNORMAL HIGH (ref 9–20)
BUN: 24 mg/dL (ref 6–24)
Bilirubin Total: 0.7 mg/dL (ref 0.0–1.2)
CO2: 26 mmol/L (ref 20–29)
Calcium: 9.1 mg/dL (ref 8.7–10.2)
Chloride: 102 mmol/L (ref 96–106)
Creatinine, Ser: 0.89 mg/dL (ref 0.76–1.27)
GFR calc Af Amer: 108 mL/min/{1.73_m2} (ref >59)
GFR calc non Af Amer: 94 mL/min/{1.73_m2} (ref >59)
Globulin, Total: 2.2 g/dL (ref 1.5–4.5)
Glucose: 100 mg/dL — ABNORMAL HIGH (ref 65–99)
Potassium: 4.4 mmol/L (ref 3.5–5.2)
Sodium: 139 mmol/L (ref 134–144)
Total Protein: 6.9 g/dL (ref 6.0–8.5)

## 2019-05-16 LAB — CBC WITH DIFFERENTIAL/PLATELET
Basophils Absolute: 0 10*3/uL (ref 0.0–0.2)
Basos: 1 %
EOS (ABSOLUTE): 0.1 10*3/uL (ref 0.0–0.4)
Eos: 1 %
Hematocrit: 49.6 % (ref 37.5–51.0)
Hemoglobin: 16.4 g/dL (ref 13.0–17.7)
Immature Grans (Abs): 0 10*3/uL (ref 0.0–0.1)
Immature Granulocytes: 0 %
Lymphocytes Absolute: 1.1 10*3/uL (ref 0.7–3.1)
Lymphs: 26 %
MCH: 30.5 pg (ref 26.6–33.0)
MCHC: 33.1 g/dL (ref 31.5–35.7)
MCV: 92 fL (ref 79–97)
Monocytes Absolute: 0.5 10*3/uL (ref 0.1–0.9)
Monocytes: 11 %
Neutrophils Absolute: 2.7 10*3/uL (ref 1.4–7.0)
Neutrophils: 61 %
Platelets: 204 10*3/uL (ref 150–450)
RBC: 5.38 x10E6/uL (ref 4.14–5.80)
RDW: 12.3 % (ref 11.6–15.4)
WBC: 4.4 10*3/uL (ref 3.4–10.8)

## 2019-05-16 LAB — LIPID PANEL
Chol/HDL Ratio: 3.3 ratio (ref 0.0–5.0)
Cholesterol, Total: 169 mg/dL (ref 100–199)
HDL: 51 mg/dL (ref >39)
LDL Chol Calc (NIH): 106 mg/dL — ABNORMAL HIGH (ref 0–99)
Triglycerides: 62 mg/dL (ref 0–149)
VLDL Cholesterol Cal: 12 mg/dL (ref 5–40)

## 2019-05-16 LAB — PSA: Prostate Specific Ag, Serum: 1.9 ng/mL (ref 0.0–4.0)

## 2019-09-06 ENCOUNTER — Other Ambulatory Visit: Payer: Self-pay | Admitting: Orthopedic Surgery

## 2019-09-06 DIAGNOSIS — G8929 Other chronic pain: Secondary | ICD-10-CM

## 2019-09-06 DIAGNOSIS — M545 Low back pain, unspecified: Secondary | ICD-10-CM

## 2019-09-06 DIAGNOSIS — M5136 Other intervertebral disc degeneration, lumbar region: Secondary | ICD-10-CM

## 2019-10-03 ENCOUNTER — Other Ambulatory Visit: Payer: Self-pay | Admitting: Internal Medicine

## 2020-04-02 ENCOUNTER — Encounter: Payer: Self-pay | Admitting: Internal Medicine

## 2020-04-02 ENCOUNTER — Ambulatory Visit (INDEPENDENT_AMBULATORY_CARE_PROVIDER_SITE_OTHER): Payer: BC Managed Care – PPO | Admitting: Internal Medicine

## 2020-04-02 ENCOUNTER — Other Ambulatory Visit: Payer: Self-pay

## 2020-04-02 VITALS — BP 106/64 | HR 61 | Temp 98.1°F | Ht 75.0 in | Wt 180.0 lb

## 2020-04-02 DIAGNOSIS — B351 Tinea unguium: Secondary | ICD-10-CM | POA: Diagnosis not present

## 2020-04-02 MED ORDER — ITRACONAZOLE 100 MG PO CAPS: 200.0000 mg | ORAL_CAPSULE | Freq: Every day | ORAL | 0 refills | Status: DC

## 2020-04-02 NOTE — Progress Notes (Signed)
Date:  04/02/2020   Name:  John Ashley   DOB:  1961-01-11   MRN:  [id number]   Chief Complaint: Nail Problem (X5 years, both feet, not painful, toe nails discolored, hard to clip toe nail )  HPI  Lab Results  Component Value Date   CREATININE 0.89 05/15/2019   BUN 24 05/15/2019   NA 139 05/15/2019   K 4.4 05/15/2019   CL 102 05/15/2019   CO2 26 05/15/2019   Lab Results  Component Value Date   CHOL 169 05/15/2019   HDL 51 05/15/2019   LDLCALC 106 (H) 05/15/2019   TRIG 62 05/15/2019   CHOLHDL 3.3 05/15/2019   Lab Results  Component Value Date   TSH 0.993 12/09/2015   Lab Results  Component Value Date   HGBA1C 5.5 12/21/2016   Lab Results  Component Value Date   WBC 4.4 05/15/2019   HGB 16.4 05/15/2019   HCT 49.6 05/15/2019   MCV 92 05/15/2019   PLT 204 05/15/2019   Lab Results  Component Value Date   ALT 22 05/15/2019   AST 19 05/15/2019   ALKPHOS 64 05/15/2019   BILITOT 0.7 05/15/2019     Review of Systems  Gastrointestinal: Negative for abdominal pain and constipation.  Skin:       Toe nail fungus with thick nails on 8 of 10 toes    Patient Active Problem List   Diagnosis Date Noted  . DDD (degenerative disc disease), lumbar 10/22/2017  . Enlarged prostate on rectal examination 12/21/2016  . GERD (gastroesophageal reflux disease) 01/08/2016  . Allergic rhinitis 01/08/2016  . Colon polyp, hyperplastic 12/10/2015  . Vitamin D deficiency 12/10/2015  . Reactive hypoglycemia 12/09/2015  . Bilateral inguinal hernia 12/09/2015    No Known Allergies  Past Surgical History:  Procedure Laterality Date  . COLONOSCOPY  2015   repeat in 3 years d/t polyps  . COLONOSCOPY WITH PROPOFOL N/A 02/08/2017   Procedure: COLONOSCOPY WITH PROPOFOL;  Surgeon: [name], MD;  Location: [facility];  Service: Endoscopy;  Laterality: N/A;  pt requests early as possible  . perforated eardrum Right   . POLYPECTOMY  02/08/2017   Procedure: POLYPECTOMY;   Surgeon: [name], MD;  Location: [facility];  Service: Endoscopy;;  . TONSILLECTOMY      Social History   Tobacco Use  . Smoking status: Never Smoker  . Smokeless tobacco: Never Used  Vaping Use  . Vaping Use: Never used  Substance Use Topics  . Alcohol use: Yes    Alcohol/week: 2.0 standard drinks    Types: 2 Cans of beer per week  . Drug use: No     Medication list has been reviewed and updated.  Current Meds  Medication Sig  . cetirizine (ZYRTEC) 10 MG tablet Take 10 mg by mouth daily. otc  . Cholecalciferol (VITAMIN D3) 2000 units capsule Take 2,000 Units by mouth daily.  . naproxen (NAPROSYN) 500 MG tablet Take 500 mg by mouth daily. For back  . omeprazole (PRILOSEC) 20 MG capsule TAKE ONE CAPSULE BY MOUTH DAILY    PHQ 2/9 Scores 04/02/2020 05/15/2019 05/10/2018 10/11/2017  PHQ - 2 Score 0 0 0 0  PHQ- 9 Score 0 - 0 0    GAD 7 : Generalized Anxiety Score 04/02/2020  Nervous, Anxious, on Edge 0  Control/stop worrying 0  Worry too much - different things 0  Trouble relaxing 0  Restless 0  Easily annoyed or irritable 0  Afraid -  awful might happen 0  Total GAD 7 Score 0  Anxiety Difficulty Not difficult at all    BP Readings from Last 3 Encounters:  04/02/20 106/64  05/15/19 118/78  05/10/18 112/76    Physical Exam Vitals and nursing note reviewed.  Constitutional:      General: He is not in acute distress.    Appearance: He is well-developed.  HENT:     Head: Normocephalic and atraumatic.  Pulmonary:     Effort: Pulmonary effort is normal. No respiratory distress.  Musculoskeletal:        General: Normal range of motion.  Skin:    General: Skin is warm and dry.     Findings: No rash.     Comments: Thick yellow dystrophic nails on all nails but the second toes on both feet  Neurological:     Mental Status: He is alert and oriented to person, place, and time.  Psychiatric:        Behavior: Behavior normal.        Thought Content: Thought  content normal.     Wt Readings from Last 3 Encounters:  04/02/20 180 lb (81.6 kg)  05/15/19 181 lb (82.1 kg)  05/10/18 180 lb (81.6 kg)    BP 106/64   Pulse 61   Temp 98.1 F (36.7 C) (Oral)   Ht 6\' 3"  (1.905 m)   Wt 180 lb (81.6 kg)   SpO2 98%   BMI 22.50 kg/m   Assessment and Plan: 1. Onychomycosis Will obtain labs at his [facility] in 6 weeks - itraconazole (SPORANOX) 100 MG capsule; Take 2 capsules (200 mg total) by mouth daily.  Dispense: 180 capsule; Refill: 0   Partially dictated using Editor, commissioning. Any errors are unintentional.  Halina Maidens, MD [facility]  04/02/2020

## 2020-04-03 ENCOUNTER — Telehealth: Payer: Self-pay

## 2020-04-03 NOTE — Telephone Encounter (Signed)
Completed PA in covermymeds. Waiting on insurance approval.  Key: [id number] - Rx #: [id number]  KP

## 2020-04-04 NOTE — Telephone Encounter (Signed)
[employer] denied medication.  KP

## 2020-04-12 ENCOUNTER — Other Ambulatory Visit: Payer: Self-pay

## 2020-04-12 ENCOUNTER — Telehealth: Payer: Self-pay

## 2020-04-12 DIAGNOSIS — B351 Tinea unguium: Secondary | ICD-10-CM

## 2020-04-12 NOTE — Telephone Encounter (Signed)
Called pt to let him know that we placed a RF for him to Dermatology. Pts name was stated on VM.  KP

## 2020-05-15 ENCOUNTER — Other Ambulatory Visit: Payer: Self-pay

## 2020-05-15 ENCOUNTER — Ambulatory Visit (INDEPENDENT_AMBULATORY_CARE_PROVIDER_SITE_OTHER): Payer: BC Managed Care – PPO | Admitting: Internal Medicine

## 2020-05-15 ENCOUNTER — Encounter: Payer: Self-pay | Admitting: Internal Medicine

## 2020-05-15 VITALS — BP 104/68 | HR 58 | Temp 97.9°F | Ht 75.0 in | Wt 183.0 lb

## 2020-05-15 DIAGNOSIS — K219 Gastro-esophageal reflux disease without esophagitis: Secondary | ICD-10-CM

## 2020-05-15 DIAGNOSIS — Z1211 Encounter for screening for malignant neoplasm of colon: Secondary | ICD-10-CM

## 2020-05-15 DIAGNOSIS — E785 Hyperlipidemia, unspecified: Secondary | ICD-10-CM | POA: Diagnosis not present

## 2020-05-15 DIAGNOSIS — Z0000 Encounter for general adult medical examination without abnormal findings: Secondary | ICD-10-CM | POA: Diagnosis not present

## 2020-05-15 DIAGNOSIS — M5136 Other intervertebral disc degeneration, lumbar region: Secondary | ICD-10-CM | POA: Diagnosis not present

## 2020-05-15 DIAGNOSIS — Z125 Encounter for screening for malignant neoplasm of prostate: Secondary | ICD-10-CM | POA: Diagnosis not present

## 2020-05-15 DIAGNOSIS — Z5181 Encounter for therapeutic drug level monitoring: Secondary | ICD-10-CM

## 2020-05-15 LAB — POCT URINALYSIS DIPSTICK
Bilirubin, UA: NEGATIVE
Blood, UA: NEGATIVE
Glucose, UA: NEGATIVE
Ketones, UA: NEGATIVE
Leukocytes, UA: NEGATIVE
Nitrite, UA: NEGATIVE
Protein, UA: NEGATIVE
Spec Grav, UA: 1.015 (ref 1.010–1.025)
Urobilinogen, UA: 0.2 E.U./dL
pH, UA: 6.5 (ref 5.0–8.0)

## 2020-05-15 NOTE — Progress Notes (Signed)
Date:  05/15/2020   Name:  John Ashley   DOB:  14-Oct-1960   MRN:  [id number]   Chief Complaint: Annual Exam (Declined flu shot.)  John Ashley is a 59 y.o. male who presents today for his Complete Annual Exam. He feels well. He reports exercising - none. He reports he is sleeping well. He is not interested in Shingrix at this time.  He will get the flu shot at work.  Colonoscopy: 01/2017 repeat due  Immunization History  Administered Date(s) Administered  . Influenza-Unspecified 05/20/2019  . PFIZER SARS-COV-2 Vaccination 12/10/2019, 12/31/2019    Gastroesophageal Reflux He complains of heartburn. He reports no abdominal pain, no chest pain, no choking or no wheezing. This is a recurrent problem. The problem occurs rarely. Pertinent negatives include no fatigue. He has tried a PPI for the symptoms.  Nail fungus - now on sporanox - paid cash rather than going to [employer].  No side effects to medications.  Too early to see any change in his nails.  Lab Results  Component Value Date   CREATININE 0.89 05/15/2019   BUN 24 05/15/2019   NA 139 05/15/2019   K 4.4 05/15/2019   CL 102 05/15/2019   CO2 26 05/15/2019   Lab Results  Component Value Date   CHOL 169 05/15/2019   HDL 51 05/15/2019   LDLCALC 106 (H) 05/15/2019   TRIG 62 05/15/2019   CHOLHDL 3.3 05/15/2019   Lab Results  Component Value Date   TSH 0.993 12/09/2015   Lab Results  Component Value Date   HGBA1C 5.5 12/21/2016   Lab Results  Component Value Date   WBC 4.4 05/15/2019   HGB 16.4 05/15/2019   HCT 49.6 05/15/2019   MCV 92 05/15/2019   PLT 204 05/15/2019   Lab Results  Component Value Date   ALT 22 05/15/2019   AST 19 05/15/2019   ALKPHOS 64 05/15/2019   BILITOT 0.7 05/15/2019     Review of Systems  Constitutional: Negative for appetite change, chills, diaphoresis, fatigue and unexpected weight change.  HENT: Negative for hearing loss, tinnitus, trouble swallowing and voice change.   Eyes:  Negative for visual disturbance.  Respiratory: Negative for choking, shortness of breath and wheezing.   Cardiovascular: Negative for chest pain, palpitations and leg swelling.  Gastrointestinal: Positive for heartburn. Negative for abdominal pain, blood in stool, constipation and diarrhea.  Genitourinary: Negative for difficulty urinating, dysuria and frequency.  Musculoskeletal: Positive for back pain. Negative for arthralgias and myalgias.  Skin: Negative for color change and rash.  Neurological: Negative for dizziness, syncope and headaches.  Hematological: Negative for adenopathy.  Psychiatric/Behavioral: Negative for dysphoric mood and sleep disturbance.    Patient Active Problem List   Diagnosis Date Noted  . DDD (degenerative disc disease), lumbar 10/22/2017  . Enlarged prostate on rectal examination 12/21/2016  . GERD (gastroesophageal reflux disease) 01/08/2016  . Allergic rhinitis 01/08/2016  . Colon polyp, hyperplastic 12/10/2015  . Vitamin D deficiency 12/10/2015  . Reactive hypoglycemia 12/09/2015  . Bilateral inguinal hernia 12/09/2015    No Known Allergies  Past Surgical History:  Procedure Laterality Date  . COLONOSCOPY  2015   repeat in 3 years d/t polyps  . COLONOSCOPY WITH PROPOFOL N/A 02/08/2017   Procedure: COLONOSCOPY WITH PROPOFOL;  Surgeon: [name], MD;  Location: [place];  Service: Endoscopy;  Laterality: N/A;  pt requests early as possible  . perforated eardrum Right   . POLYPECTOMY  02/08/2017   Procedure: POLYPECTOMY;  Surgeon: [name], MD;  Location: [place];  Service: Endoscopy;;  . TONSILLECTOMY      Social History   Tobacco Use  . Smoking status: Never Smoker  . Smokeless tobacco: Never Used  Vaping Use  . Vaping Use: Never used  Substance Use Topics  . Alcohol use: Yes    Alcohol/week: 2.0 standard drinks    Types: 2 Cans of beer per week  . Drug use: No     Medication list has been reviewed and  updated.  Current Meds  Medication Sig  . cetirizine (ZYRTEC) 10 MG tablet Take 10 mg by mouth daily. otc  . Cholecalciferol (VITAMIN D3) 2000 units capsule Take 2,000 Units by mouth daily.  Marland Kitchen itraconazole (SPORANOX) 100 MG capsule Take 2 capsules (200 mg total) by mouth daily.  . naproxen (NAPROSYN) 250 MG tablet Take 500 mg by mouth daily. For back   . omeprazole (PRILOSEC) 20 MG capsule TAKE ONE CAPSULE BY MOUTH DAILY    PHQ 2/9 Scores 04/02/2020 05/15/2019 05/10/2018 10/11/2017  PHQ - 2 Score 0 0 0 0  PHQ- 9 Score 0 - 0 0    GAD 7 : Generalized Anxiety Score 04/02/2020  Nervous, Anxious, on Edge 0  Control/stop worrying 0  Worry too much - different things 0  Trouble relaxing 0  Restless 0  Easily annoyed or irritable 0  Afraid - awful might happen 0  Total GAD 7 Score 0  Anxiety Difficulty Not difficult at all    BP Readings from Last 3 Encounters:  05/15/20 104/68  04/02/20 106/64  05/15/19 118/78    Physical Exam Vitals and nursing note reviewed.  Constitutional:      Appearance: Normal appearance. He is well-developed.  HENT:     Head: Normocephalic.     Right Ear: Tympanic membrane, ear canal and external ear normal.     Left Ear: Tympanic membrane, ear canal and external ear normal.     Nose: Nose normal.  Eyes:     Conjunctiva/sclera: Conjunctivae normal.     Pupils: Pupils are equal, round, and reactive to light.  Neck:     Thyroid: No thyromegaly.     Vascular: No carotid bruit.  Cardiovascular:     Rate and Rhythm: Normal rate and regular rhythm.     Heart sounds: Normal heart sounds.  Pulmonary:     Effort: Pulmonary effort is normal.     Breath sounds: Normal breath sounds. No wheezing.  Chest:     Breasts:        Right: No mass.        Left: No mass.  Abdominal:     General: Bowel sounds are normal.     Palpations: Abdomen is soft.     Tenderness: There is no abdominal tenderness.  Musculoskeletal:        General: Normal range of motion.      Cervical back: Normal range of motion and neck supple.  Lymphadenopathy:     Cervical: No cervical adenopathy.  Skin:    General: Skin is warm and dry.     Capillary Refill: Capillary refill takes less than 2 seconds.     Comments: Thick yellow nails on toes  Neurological:     General: No focal deficit present.     Mental Status: He is alert and oriented to person, place, and time.     Deep Tendon Reflexes: Reflexes are normal and symmetric.  Psychiatric:  Speech: Speech normal.        Behavior: Behavior normal.        Thought Content: Thought content normal.        Judgment: Judgment normal.     Wt Readings from Last 3 Encounters:  05/15/20 183 lb (83 kg)  04/02/20 180 lb (81.6 kg)  05/15/19 181 lb (82.1 kg)    BP 104/68   Pulse (!) 58   Temp 97.9 F (36.6 C) (Oral)   Ht 6\' 3"  (1.905 m)   Wt 183 lb (83 kg)   SpO2 98%   BMI 22.87 kg/m   Assessment and Plan: 1. Annual physical exam Normal exam - continue healthy diet, exercise - Comprehensive metabolic panel - Lipid panel - POCT urinalysis dipstick  2. Prostate cancer screening DRE deferred to lack of sx - PSA  3. Gastroesophageal reflux disease, unspecified whether esophagitis present Symptoms well controlled on daily PPI No red flag signs such as weight loss, n/v, melena Will continue omeprazole. - CBC with Differential/Platelet  4. DDD (degenerative disc disease), lumbar Chronic; managed with daily nsaids and activity modification  5. Therapeutic drug monitoring On sporanox - checking CBC and LFTs  6. Colon cancer screening Due for 3 year follow up due to strong family hx - Ambulatory referral to Gastroenterology   Partially dictated using Dragon software. Any errors are unintentional.  Halina Maidens, MD [facility]  05/15/2020

## 2020-05-16 LAB — CBC WITH DIFFERENTIAL/PLATELET
Basophils Absolute: 0 10*3/uL (ref 0.0–0.2)
Basos: 1 %
EOS (ABSOLUTE): 0.1 10*3/uL (ref 0.0–0.4)
Eos: 1 %
Hematocrit: 49.3 % (ref 37.5–51.0)
Hemoglobin: 16.2 g/dL (ref 13.0–17.7)
Immature Grans (Abs): 0 10*3/uL (ref 0.0–0.1)
Immature Granulocytes: 0 %
Lymphocytes Absolute: 1.1 10*3/uL (ref 0.7–3.1)
Lymphs: 26 %
MCH: 30 pg (ref 26.6–33.0)
MCHC: 32.9 g/dL (ref 31.5–35.7)
MCV: 91 fL (ref 79–97)
Monocytes Absolute: 0.5 10*3/uL (ref 0.1–0.9)
Monocytes: 11 %
Neutrophils Absolute: 2.5 10*3/uL (ref 1.4–7.0)
Neutrophils: 61 %
Platelets: 201 10*3/uL (ref 150–450)
RBC: 5.4 x10E6/uL (ref 4.14–5.80)
RDW: 12.1 % (ref 11.6–15.4)
WBC: 4.2 10*3/uL (ref 3.4–10.8)

## 2020-05-16 LAB — COMPREHENSIVE METABOLIC PANEL
ALT: 21 IU/L (ref 0–44)
AST: 22 IU/L (ref 0–40)
Albumin/Globulin Ratio: 2.1 (ref 1.2–2.2)
Albumin: 4.6 g/dL (ref 3.8–4.9)
Alkaline Phosphatase: 60 IU/L (ref 44–121)
BUN/Creatinine Ratio: 26 — ABNORMAL HIGH (ref 10–24)
BUN: 23 mg/dL (ref 8–27)
Bilirubin Total: 0.8 mg/dL (ref 0.0–1.2)
CO2: 23 mmol/L (ref 20–29)
Calcium: 9.1 mg/dL (ref 8.6–10.2)
Chloride: 104 mmol/L (ref 96–106)
Creatinine, Ser: 0.88 mg/dL (ref 0.76–1.27)
GFR calc Af Amer: 108 mL/min/{1.73_m2} (ref >59)
GFR calc non Af Amer: 93 mL/min/{1.73_m2} (ref >59)
Globulin, Total: 2.2 g/dL (ref 1.5–4.5)
Glucose: 87 mg/dL (ref 65–99)
Potassium: 4.4 mmol/L (ref 3.5–5.2)
Sodium: 142 mmol/L (ref 134–144)
Total Protein: 6.8 g/dL (ref 6.0–8.5)

## 2020-05-16 LAB — LIPID PANEL
Chol/HDL Ratio: 3 ratio (ref 0.0–5.0)
Cholesterol, Total: 146 mg/dL (ref 100–199)
HDL: 48 mg/dL (ref >39)
LDL Chol Calc (NIH): 81 mg/dL (ref 0–99)
Triglycerides: 88 mg/dL (ref 0–149)
VLDL Cholesterol Cal: 17 mg/dL (ref 5–40)

## 2020-05-16 LAB — PSA: Prostate Specific Ag, Serum: 3.1 ng/mL (ref 0.0–4.0)

## 2020-05-30 ENCOUNTER — Telehealth (INDEPENDENT_AMBULATORY_CARE_PROVIDER_SITE_OTHER): Payer: Self-pay | Admitting: Gastroenterology

## 2020-05-30 ENCOUNTER — Other Ambulatory Visit (INDEPENDENT_AMBULATORY_CARE_PROVIDER_SITE_OTHER): Payer: Self-pay

## 2020-05-30 DIAGNOSIS — Z86010 Personal history of colonic polyps: Secondary | ICD-10-CM

## 2020-05-30 MED ORDER — SUTAB 1479-225-188 MG PO TABS: ORAL_TABLET | ORAL | 0 refills | Status: DC

## 2020-05-30 NOTE — Progress Notes (Signed)
Gastroenterology Pre-Procedure Review  Request Date: 07/05/2020 Requesting Physician: Dr. Allen Norris  PATIENT REVIEW QUESTIONS: The patient responded to the following health history questions as indicated:    1. Are you having any GI issues? no 2. Do you have a personal history of Polyps? yes (02/08/2017 at [facility] done by Dr. Allen Norris) 3. Do you have a family history of Colon Cancer or Polyps? yes (Father colon cancer, Older Brother colon cancer) 4. Diabetes Mellitus? NO 5. Joint replacements in the past 12 months?no 6. Major health problems in the past 3 months?no 7. Any artificial heart valves, MVP, or defibrillator?no    MEDICATIONS & ALLERGIES:    Patient reports the following regarding taking any anticoagulation/antiplatelet therapy:   Plavix, Coumadin, Eliquis, Xarelto, Lovenox, Pradaxa, Brilinta, or Effient? no Aspirin? no  Patient confirms/reports the following medications:  Current Outpatient Medications  Medication Sig Dispense Refill   cetirizine (ZYRTEC) 10 MG tablet Take 10 mg by mouth daily. otc     Cholecalciferol (VITAMIN D3) 2000 units capsule Take 2,000 Units by mouth daily.     itraconazole (SPORANOX) 100 MG capsule Take 2 capsules (200 mg total) by mouth daily. 180 capsule 0   naproxen (NAPROSYN) 250 MG tablet Take 500 mg by mouth daily. For back      omeprazole (PRILOSEC) 20 MG capsule TAKE ONE CAPSULE BY MOUTH DAILY 30 capsule 11   Sodium Sulfate-Mag Sulfate-KCl (SUTAB) [phone] MG TABS Take the first bottle of pills. Taking each pill with a sip of water. Start at 5pm. Using the 8 oz. Cup provided in the kit drink 5 cups of water after completing the first bottle of 12 pills. Repeat again 5 hours prior to the time of procedure with the second bottle. NPO 4 hours prior to procedure. 24 tablet 0   No current facility-administered medications for this visit.    Patient confirms/reports the following allergies:  No Known Allergies  No orders of the defined types  were placed in this encounter.   AUTHORIZATION INFORMATION Primary Insurance: 1D#: Group #:  Secondary Insurance: 1D#: Group #:  SCHEDULE INFORMATION: Date: 07/05/2020 Time: Location:[facility]

## 2020-06-06 DIAGNOSIS — Z872 Personal history of diseases of the skin and subcutaneous tissue: Secondary | ICD-10-CM | POA: Diagnosis not present

## 2020-06-06 DIAGNOSIS — L578 Other skin changes due to chronic exposure to nonionizing radiation: Secondary | ICD-10-CM | POA: Diagnosis not present

## 2020-06-06 DIAGNOSIS — Z86018 Personal history of other benign neoplasm: Secondary | ICD-10-CM | POA: Diagnosis not present

## 2020-06-19 ENCOUNTER — Telehealth: Payer: Self-pay | Admitting: Gastroenterology

## 2020-06-19 NOTE — Telephone Encounter (Signed)
Patient wife calling to cancel patients colon for 11.12.21 with Dr. Allen Norris. Pt had screening call on 10.7.21. Pt wife did not wish to reschedule at this time. Please cancel appt.

## 2020-07-05 ENCOUNTER — Encounter: Admission: RE | Payer: Self-pay | Source: Home / Self Care

## 2020-07-05 ENCOUNTER — Ambulatory Visit
Admission: RE | Admit: 2020-07-05 | Payer: BC Managed Care – PPO | Source: Home / Self Care | Admitting: Gastroenterology

## 2020-07-05 SURGERY — COLONOSCOPY WITH PROPOFOL
Anesthesia: Choice

## 2020-07-29 ENCOUNTER — Ambulatory Visit: Payer: Managed Care, Other (non HMO) | Admitting: Dermatology

## 2020-09-02 DIAGNOSIS — Z1159 Encounter for screening for other viral diseases: Secondary | ICD-10-CM | POA: Diagnosis not present

## 2020-09-02 DIAGNOSIS — Z20822 Contact with and (suspected) exposure to covid-19: Secondary | ICD-10-CM | POA: Diagnosis not present

## 2020-09-02 DIAGNOSIS — Z20828 Contact with and (suspected) exposure to other viral communicable diseases: Secondary | ICD-10-CM | POA: Diagnosis not present

## 2020-09-05 DIAGNOSIS — Z20822 Contact with and (suspected) exposure to covid-19: Secondary | ICD-10-CM | POA: Diagnosis not present

## 2020-09-06 ENCOUNTER — Telehealth: Payer: Self-pay

## 2020-09-06 NOTE — Telephone Encounter (Signed)
Copied from [place] [id number]. Topic: General - Other >> Sep 06, 2020  9:52 AM Leward Quan A wrote: Reason for CRM: Patient called in to inform Dr Army Melia that he came home from work sick on Friday 08/30/20 took a Covid test that came out positive and been out of work since. States that his job now want a note from his Dr in order for him to return to work. Asking Dr Army Melia what should he do went and took another Covid test on 09/05/20 please advise Ph# [phone]

## 2020-09-06 NOTE — Telephone Encounter (Signed)
Called pt left VM. We do not give notes for work. We did not take pt out of work. And we don't have any record of a positive test.   Will route result note to [facility] Nurse Triage for follow up when patient returns call to clinic. Nurse may give results to patient if they return call. CRM created for this message.   KP

## 2020-10-05 ENCOUNTER — Other Ambulatory Visit: Payer: Self-pay | Admitting: Internal Medicine

## 2020-10-11 ENCOUNTER — Telehealth: Payer: Self-pay

## 2020-10-11 NOTE — Telephone Encounter (Signed)
Please call pt for labs only visit for PSA. 11/13/2020 or later   KP

## 2020-10-11 NOTE — Telephone Encounter (Unsigned)
Copied from [place] [id number]. Topic: Appointment Scheduling - Scheduling Inquiry for Clinic >> Oct 09, 2020 12:43 PM Lennox Solders wrote: Reason for CRM: Pt was seen in sept 2021 and per result notes needs to repeat PSA in 6 month. I do not see order. Please advise

## 2020-10-11 NOTE — Telephone Encounter (Signed)
Pt scheduled for 11/18/20

## 2020-11-18 ENCOUNTER — Other Ambulatory Visit: Payer: Self-pay

## 2020-11-18 ENCOUNTER — Other Ambulatory Visit: Payer: BC Managed Care – PPO

## 2020-11-18 DIAGNOSIS — Z125 Encounter for screening for malignant neoplasm of prostate: Secondary | ICD-10-CM

## 2020-11-18 NOTE — Progress Notes (Signed)
psa

## 2020-11-19 LAB — PSA: Prostate Specific Ag, Serum: 2.5 ng/mL (ref 0.0–4.0)

## 2021-03-25 DIAGNOSIS — H05822 Myopathy of extraocular muscles, left orbit: Secondary | ICD-10-CM | POA: Diagnosis not present

## 2021-03-31 ENCOUNTER — Other Ambulatory Visit: Payer: Self-pay | Admitting: Internal Medicine

## 2021-04-16 DIAGNOSIS — L821 Other seborrheic keratosis: Secondary | ICD-10-CM | POA: Diagnosis not present

## 2021-05-19 ENCOUNTER — Encounter: Payer: Self-pay | Admitting: Internal Medicine

## 2021-05-19 ENCOUNTER — Ambulatory Visit (INDEPENDENT_AMBULATORY_CARE_PROVIDER_SITE_OTHER): Payer: BC Managed Care – PPO | Admitting: Internal Medicine

## 2021-05-19 ENCOUNTER — Other Ambulatory Visit: Payer: Self-pay

## 2021-05-19 VITALS — BP 128/74 | HR 64 | Ht 75.0 in | Wt 184.0 lb

## 2021-05-19 DIAGNOSIS — Z1211 Encounter for screening for malignant neoplasm of colon: Secondary | ICD-10-CM

## 2021-05-19 DIAGNOSIS — Z125 Encounter for screening for malignant neoplasm of prostate: Secondary | ICD-10-CM

## 2021-05-19 DIAGNOSIS — Z0000 Encounter for general adult medical examination without abnormal findings: Secondary | ICD-10-CM | POA: Diagnosis not present

## 2021-05-19 DIAGNOSIS — K219 Gastro-esophageal reflux disease without esophagitis: Secondary | ICD-10-CM

## 2021-05-19 MED ORDER — OMEPRAZOLE 20 MG PO CPDR: 20.0000 mg | DELAYED_RELEASE_CAPSULE | Freq: Every day | ORAL | 3 refills | Status: DC

## 2021-05-19 NOTE — Progress Notes (Signed)
Date:  05/19/2021   Name:  John Ashley   DOB:  01-30-1961   MRN:  [id number]   Chief Complaint: Annual Exam John Ashley is a 60 y.o. male who presents today for his Complete Annual Exam. He feels well. He reports exercising - none at this time. He reports he is sleeping fairly well.   Colonoscopy: Completed in [place]. Completed this year 2022.  Immunization History  Administered Date(s) Administered   Influenza-Unspecified 05/20/2019   PFIZER(Purple Top)SARS-COV-2 Vaccination 12/10/2019, 12/31/2019    Gastroesophageal Reflux He complains of heartburn. He reports no abdominal pain, no chest pain, no choking or no wheezing. The problem occurs occasionally. Pertinent negatives include no fatigue. He has tried a PPI for the symptoms. The treatment provided significant relief.   Lab Results  Component Value Date   CREATININE 0.88 05/15/2020   BUN 23 05/15/2020   NA 142 05/15/2020   K 4.4 05/15/2020   CL 104 05/15/2020   CO2 23 05/15/2020   Lab Results  Component Value Date   CHOL 146 05/15/2020   HDL 48 05/15/2020   LDLCALC 81 05/15/2020   TRIG 88 05/15/2020   CHOLHDL 3.0 05/15/2020   Lab Results  Component Value Date   TSH 0.993 12/09/2015   Lab Results  Component Value Date   HGBA1C 5.5 12/21/2016   Lab Results  Component Value Date   WBC 4.2 05/15/2020   HGB 16.2 05/15/2020   HCT 49.3 05/15/2020   MCV 91 05/15/2020   PLT 201 05/15/2020   Lab Results  Component Value Date   ALT 21 05/15/2020   AST 22 05/15/2020   ALKPHOS 60 05/15/2020   BILITOT 0.8 05/15/2020     Review of Systems  Constitutional:  Negative for appetite change, chills, diaphoresis, fatigue and unexpected weight change.  HENT:  Negative for hearing loss, tinnitus, trouble swallowing and voice change.   Eyes:  Negative for visual disturbance.  Respiratory:  Negative for choking, shortness of breath and wheezing.   Cardiovascular:  Negative for chest pain,  palpitations and leg swelling.  Gastrointestinal:  Positive for heartburn. Negative for abdominal pain, blood in stool, constipation and diarrhea.  Genitourinary:  Negative for difficulty urinating, dysuria and frequency.  Musculoskeletal:  Negative for arthralgias, back pain and myalgias.  Skin:  Negative for color change and rash.  Neurological:  Negative for dizziness, syncope and headaches.  Hematological:  Negative for adenopathy.  Psychiatric/Behavioral:  Negative for dysphoric mood and sleep disturbance. The patient is not nervous/anxious.    Patient Active Problem List   Diagnosis Date Noted   DDD (degenerative disc disease), lumbar 10/22/2017   Personal history of colonic polyps    Enlarged prostate on rectal examination 12/21/2016   GERD (gastroesophageal reflux disease) 01/08/2016   Allergic rhinitis 01/08/2016   Colon polyp, hyperplastic 12/10/2015   Vitamin D deficiency 12/10/2015   Reactive hypoglycemia 12/09/2015   Bilateral inguinal hernia 12/09/2015    No Known Allergies  Past Surgical History:  Procedure Laterality Date   COLONOSCOPY  2015   repeat in 3 years d/t polyps   COLONOSCOPY WITH PROPOFOL N/A 02/08/2017   Procedure: COLONOSCOPY WITH PROPOFOL;  Surgeon: [name], MD;  Location: [place];  Service: Endoscopy;  Laterality: N/A;  pt requests early as possible   perforated eardrum Right    POLYPECTOMY  02/08/2017   Procedure: POLYPECTOMY;  Surgeon: [name], MD;  Location: [place];  Service: Endoscopy;;   TONSILLECTOMY  Social History   Tobacco Use   Smoking status: Never   Smokeless tobacco: Never  Vaping Use   Vaping Use: Never used  Substance Use Topics   Alcohol use: Yes    Alcohol/week: 2.0 standard drinks    Types: 2 Cans of beer per week   Drug use: No     Medication list has been reviewed and updated.  Current Meds  Medication Sig   cetirizine (ZYRTEC) 10 MG tablet Take 10 mg by mouth daily. otc    Cholecalciferol (VITAMIN D3) 2000 units capsule Take 2,000 Units by mouth daily.   naproxen (NAPROSYN) 250 MG tablet Take 500 mg by mouth daily. For back    omeprazole (PRILOSEC) 20 MG capsule TAKE ONE CAPSULE BY MOUTH DAILY    PHQ 2/9 Scores 05/19/2021 04/02/2020 05/15/2019 05/10/2018  PHQ - 2 Score 0 0 0 0  PHQ- 9 Score 0 0 - 0    GAD 7 : Generalized Anxiety Score 05/19/2021 04/02/2020  Nervous, Anxious, on Edge 0 0  Control/stop worrying 0 0  Worry too much - different things 0 0  Trouble relaxing 0 0  Restless 0 0  Easily annoyed or irritable 0 0  Afraid - awful might happen 0 0  Total GAD 7 Score 0 0  Anxiety Difficulty Not difficult at all Not difficult at all    BP Readings from Last 3 Encounters:  05/19/21 128/74  05/15/20 104/68  04/02/20 106/64    Physical Exam Vitals and nursing note reviewed.  Constitutional:      Appearance: Normal appearance. He is well-developed.  HENT:     Head: Normocephalic.     Right Ear: Tympanic membrane, ear canal and external ear normal.     Left Ear: Tympanic membrane, ear canal and external ear normal.     Nose: Nose normal.  Eyes:     Conjunctiva/sclera: Conjunctivae normal.     Pupils: Pupils are equal, round, and reactive to light.  Neck:     Thyroid: No thyromegaly.     Vascular: No carotid bruit.  Cardiovascular:     Rate and Rhythm: Normal rate and regular rhythm.     Heart sounds: Normal heart sounds.  Pulmonary:     Effort: Pulmonary effort is normal.     Breath sounds: Normal breath sounds. No wheezing.  Chest:  Breasts:    Right: No mass.     Left: No mass.  Abdominal:     General: Bowel sounds are normal.     Palpations: Abdomen is soft.     Tenderness: There is no abdominal tenderness.  Musculoskeletal:        General: Normal range of motion.     Cervical back: Normal range of motion and neck supple.  Lymphadenopathy:     Cervical: No cervical adenopathy.  Skin:    General: Skin is warm and dry.   Neurological:     Mental Status: He is alert and oriented to person, place, and time.     Deep Tendon Reflexes: Reflexes are normal and symmetric.  Psychiatric:        Attention and Perception: Attention normal.        Mood and Affect: Mood normal.        Thought Content: Thought content normal.    Wt Readings from Last 3 Encounters:  05/19/21 184 lb (83.5 kg)  05/15/20 183 lb (83 kg)  04/02/20 180 lb (81.6 kg)    BP 128/74   Pulse 64  Ht 6\' 3"  (1.905 m)   Wt 184 lb (83.5 kg)   BMI 23.00 kg/m   Assessment and Plan: 1. Annual physical exam Normal exam He declines immunizations but is aware that he can schedule these with nurse if desired - Comprehensive metabolic panel - Lipid panel  2. Colon cancer screening Done recently He will forward the report  3. Prostate cancer screening DRE deferred - PSA  4. Gastroesophageal reflux disease, unspecified whether esophagitis present Symptoms well controlled on daily PPI No red flag signs such as weight loss, n/v, melena Will continue omeprazole. - CBC with Differential/Platelet - omeprazole (PRILOSEC) 20 MG capsule; Take 1 capsule (20 mg total) by mouth daily.  Dispense: 90 capsule; Refill: 3   Partially dictated using Editor, commissioning. Any errors are unintentional.  Halina Maidens, MD [facility]  05/19/2021

## 2021-05-20 LAB — COMPREHENSIVE METABOLIC PANEL
ALT: 20 IU/L (ref 0–44)
AST: 20 IU/L (ref 0–40)
Albumin/Globulin Ratio: 2.1 (ref 1.2–2.2)
Albumin: 4.6 g/dL (ref 3.8–4.8)
Alkaline Phosphatase: 68 IU/L (ref 44–121)
BUN/Creatinine Ratio: 25 — ABNORMAL HIGH (ref 10–24)
BUN: 24 mg/dL (ref 8–27)
Bilirubin Total: 0.7 mg/dL (ref 0.0–1.2)
CO2: 21 mmol/L (ref 20–29)
Calcium: 8.9 mg/dL (ref 8.6–10.2)
Chloride: 102 mmol/L (ref 96–106)
Creatinine, Ser: 0.95 mg/dL (ref 0.76–1.27)
Globulin, Total: 2.2 g/dL (ref 1.5–4.5)
Glucose: 97 mg/dL (ref 70–99)
Potassium: 4 mmol/L (ref 3.5–5.2)
Sodium: 139 mmol/L (ref 134–144)
Total Protein: 6.8 g/dL (ref 6.0–8.5)
eGFR: 91 mL/min/{1.73_m2} (ref >59)

## 2021-05-20 LAB — CBC WITH DIFFERENTIAL/PLATELET
Basophils Absolute: 0 10*3/uL (ref 0.0–0.2)
Basos: 1 %
EOS (ABSOLUTE): 0.1 10*3/uL (ref 0.0–0.4)
Eos: 2 %
Hematocrit: 48.2 % (ref 37.5–51.0)
Hemoglobin: 16.7 g/dL (ref 13.0–17.7)
Immature Grans (Abs): 0 10*3/uL (ref 0.0–0.1)
Immature Granulocytes: 0 %
Lymphocytes Absolute: 1.2 10*3/uL (ref 0.7–3.1)
Lymphs: 26 %
MCH: 31.1 pg (ref 26.6–33.0)
MCHC: 34.6 g/dL (ref 31.5–35.7)
MCV: 90 fL (ref 79–97)
Monocytes Absolute: 0.5 10*3/uL (ref 0.1–0.9)
Monocytes: 11 %
Neutrophils Absolute: 2.7 10*3/uL (ref 1.4–7.0)
Neutrophils: 60 %
Platelets: 194 10*3/uL (ref 150–450)
RBC: 5.37 x10E6/uL (ref 4.14–5.80)
RDW: 11.9 % (ref 11.6–15.4)
WBC: 4.5 10*3/uL (ref 3.4–10.8)

## 2021-05-20 LAB — LIPID PANEL
Chol/HDL Ratio: 3.3 ratio (ref 0.0–5.0)
Cholesterol, Total: 156 mg/dL (ref 100–199)
HDL: 47 mg/dL (ref >39)
LDL Chol Calc (NIH): 96 mg/dL (ref 0–99)
Triglycerides: 67 mg/dL (ref 0–149)
VLDL Cholesterol Cal: 13 mg/dL (ref 5–40)

## 2021-05-20 LAB — PSA: Prostate Specific Ag, Serum: 2.4 ng/mL (ref 0.0–4.0)

## 2021-09-09 ENCOUNTER — Ambulatory Visit: Payer: Self-pay

## 2021-09-09 NOTE — Telephone Encounter (Signed)
°  Chief Complaint: med increase Symptoms: heartburn Frequency: 1 week Pertinent Negatives: none Disposition: [] ED /[] Urgent Care (no appt availability in office) / [] Appointment(In office/virtual)/ []  [facility]/ [] Home Care/ [] Refused Recommended Disposition /[]  Mobile Bus/ [x]  Follow-up with PCP Additional Notes: Pt states he's been taking omeprazole 20mg  once daily for years and this past week he has noticed increase in heartburn so been taking omeprazole BID.he states this has been effective but wants to see if 40mg  once daily will be the same. Advised him I will send to dr. Army Melia and see if she can increase dose and send to pharmacy. Pt verbalized understanding.    Pt wants to increase his omeprazole (PRILOSEC) 20 MG capsule to 40 mg. Pt states one is not working anymore, he is having heartburn issues.  Advised pt he would need an appt to discuss with the dr.  He said  "I was just in there, and this is not a controlled substance" so pt declined appt.  [facility] [id number] Lorina Rabon, [place]   Reason for Disposition  [1] Caller has NON-URGENT medicine question about med that PCP prescribed AND [2] triager unable to answer question  Answer Assessment - Initial Assessment Questions 1. NAME of MEDICATION: "What medicine are you calling about?"     omeprazole 2. QUESTION: "What is your question?" (e.g., double dose of medicine, side effect)     Been taking 1 BID  3. PRESCRIBING HCP: "Who prescribed it?" Reason: if prescribed by specialist, call should be referred to that group.     Dr. Army Melia  4. SYMPTOMS: "Do you have any symptoms?"     none  Protocols used: Medication Question Call-A-AH

## 2021-09-09 NOTE — Telephone Encounter (Signed)
Patient informed. 

## 2021-09-25 ENCOUNTER — Telehealth: Payer: Self-pay | Admitting: Internal Medicine

## 2021-09-25 NOTE — Telephone Encounter (Signed)
Told pt to let us know when he runs out of the 20 MG so we can send in 40 MG. Pt verbalized understanding.  KP

## 2021-09-25 NOTE — Telephone Encounter (Signed)
Patient ok with increasing dosage to 40 mg on Omeprazole. He says this is helping him now,

## 2021-09-25 NOTE — Telephone Encounter (Signed)
Noted  KP 

## 2021-10-17 ENCOUNTER — Telehealth: Payer: Self-pay | Admitting: Internal Medicine

## 2021-10-17 ENCOUNTER — Other Ambulatory Visit: Payer: Self-pay

## 2021-10-17 DIAGNOSIS — K219 Gastro-esophageal reflux disease without esophagitis: Secondary | ICD-10-CM

## 2021-10-17 MED ORDER — OMEPRAZOLE 40 MG PO CPDR: 40.0000 mg | DELAYED_RELEASE_CAPSULE | Freq: Every day | ORAL | 0 refills | Status: DC

## 2021-10-17 NOTE — Telephone Encounter (Signed)
Copied from [place] [id number]. Topic: General - Other >> Oct 17, 2021  3:52 PM Leward Quan A wrote: Reason for CRM: Patient called in to remind Dr Army Melia that he is almost out of omeprazole ([place]) 20 MG capsule and would like the Rx for the new medication to replace this one sent to his pharmacy.

## 2021-10-20 NOTE — Telephone Encounter (Signed)
Refilled Rx. 

## 2022-01-16 ENCOUNTER — Other Ambulatory Visit: Payer: Self-pay | Admitting: Internal Medicine

## 2022-01-16 DIAGNOSIS — K219 Gastro-esophageal reflux disease without esophagitis: Secondary | ICD-10-CM

## 2022-01-20 NOTE — Telephone Encounter (Signed)
Requested Prescriptions  Pending Prescriptions Disp Refills  . omeprazole (PRILOSEC) 40 MG capsule [Pharmacy Med Name: OMEPRAZOLE DR 40 MG CAPSULE] 90 capsule 1    Sig: TAKE ONE CAPSULE BY MOUTH DAILY     Gastroenterology: Proton Pump Inhibitors Passed - 01/16/2022  6:20 AM      Passed - Valid encounter within last 12 months    Recent Outpatient Visits          8 months ago Annual physical exam   [facility] Glean Hess, MD   1 year ago Annual physical exam   [facility] Glean Hess, MD   1 year ago Onychomycosis   [facility] Glean Hess, MD   2 years ago Annual physical exam   [facility] Glean Hess, MD   3 years ago Annual physical exam   [facility] Glean Hess, MD      Future Appointments            In 4 months Army Melia Jesse Sans, MD [facility], [facility]

## 2022-05-21 ENCOUNTER — Ambulatory Visit (INDEPENDENT_AMBULATORY_CARE_PROVIDER_SITE_OTHER): Payer: 59 | Admitting: Internal Medicine

## 2022-05-21 ENCOUNTER — Encounter: Payer: Self-pay | Admitting: Internal Medicine

## 2022-05-21 VITALS — BP 114/82 | HR 55 | Ht 75.0 in | Wt 190.0 lb

## 2022-05-21 DIAGNOSIS — Z23 Encounter for immunization: Secondary | ICD-10-CM | POA: Diagnosis not present

## 2022-05-21 DIAGNOSIS — K219 Gastro-esophageal reflux disease without esophagitis: Secondary | ICD-10-CM

## 2022-05-21 DIAGNOSIS — Z13220 Encounter for screening for lipoid disorders: Secondary | ICD-10-CM

## 2022-05-21 DIAGNOSIS — Z0000 Encounter for general adult medical examination without abnormal findings: Secondary | ICD-10-CM | POA: Diagnosis not present

## 2022-05-21 DIAGNOSIS — Z125 Encounter for screening for malignant neoplasm of prostate: Secondary | ICD-10-CM | POA: Diagnosis not present

## 2022-05-21 DIAGNOSIS — Z131 Encounter for screening for diabetes mellitus: Secondary | ICD-10-CM

## 2022-05-21 MED ORDER — OMEPRAZOLE 40 MG PO CPDR: 40.0000 mg | DELAYED_RELEASE_CAPSULE | Freq: Every day | ORAL | 3 refills | Status: DC

## 2022-05-21 NOTE — Progress Notes (Signed)
Date:  05/21/2022   Name:  John Ashley   DOB:  Feb 19, 1961   MRN:  [id number]   Chief Complaint: Annual Exam John Ashley is a 61 y.o. male who presents today for his Complete Annual Exam. He feels well. He reports exercising - none. He reports he is sleeping well. He quit work and is at home with his 80 y/o father, doing lots of home repair, etc.  Colonoscopy: 06/2020 repeat 3 yrs  Immunization History  Administered Date(s) Administered   Influenza-Unspecified 05/20/2019   PFIZER(Purple Top)SARS-COV-2 Vaccination 12/10/2019, 12/31/2019   Health Maintenance Due  Topic Date Due   HIV Screening  Never done   TETANUS/TDAP  Never done   Zoster Vaccines- Shingrix (1 of 2) Never done    Lab Results  Component Value Date   PSA1 2.4 05/19/2021   PSA1 2.5 11/18/2020   PSA1 3.1 05/15/2020     Gastroesophageal Reflux He complains of abdominal pain (fullness), early satiety, heartburn and water brash. He reports no chest pain, no choking or no wheezing. This is a recurrent problem. The problem occurs frequently. The problem has been gradually worsening. Pertinent negatives include no fatigue. He has tried a PPI for the symptoms. The treatment provided moderate relief.  Vitamin d def - on daily supplements.  Lab Results  Component Value Date   NA 139 05/19/2021   K 4.0 05/19/2021   CO2 21 05/19/2021   GLUCOSE 97 05/19/2021   BUN 24 05/19/2021   CREATININE 0.95 05/19/2021   CALCIUM 8.9 05/19/2021   EGFR 91 05/19/2021   GFRNONAA 93 05/15/2020   Lab Results  Component Value Date   CHOL 156 05/19/2021   HDL 47 05/19/2021   LDLCALC 96 05/19/2021   TRIG 67 05/19/2021   CHOLHDL 3.3 05/19/2021   Lab Results  Component Value Date   TSH 0.993 12/09/2015   Lab Results  Component Value Date   HGBA1C 5.5 12/21/2016   Lab Results  Component Value Date   WBC 4.5 05/19/2021   HGB 16.7 05/19/2021   HCT 48.2 05/19/2021   MCV 90 05/19/2021   PLT 194 05/19/2021   Lab  Results  Component Value Date   ALT 20 05/19/2021   AST 20 05/19/2021   ALKPHOS 68 05/19/2021   BILITOT 0.7 05/19/2021   Lab Results  Component Value Date   VD25OH 48.9 05/10/2018     Review of Systems  Constitutional:  Negative for appetite change, chills, diaphoresis, fatigue and unexpected weight change.  HENT:  Negative for hearing loss, tinnitus, trouble swallowing and voice change.   Eyes:  Negative for visual disturbance.  Respiratory:  Negative for choking, shortness of breath and wheezing.   Cardiovascular:  Negative for chest pain, palpitations and leg swelling.  Gastrointestinal:  Positive for abdominal pain (fullness) and heartburn. Negative for blood in stool, constipation and diarrhea.  Genitourinary:  Negative for difficulty urinating, dysuria and frequency.  Musculoskeletal:  Negative for arthralgias, back pain and myalgias.  Skin:  Negative for color change and rash.  Neurological:  Negative for dizziness, syncope and headaches.  Hematological:  Negative for adenopathy.  Psychiatric/Behavioral:  Negative for dysphoric mood and sleep disturbance. The patient is not nervous/anxious.     Patient Active Problem List   Diagnosis Date Noted   DDD (degenerative disc disease), lumbar 10/22/2017   Personal history of colonic polyps    Enlarged prostate on rectal examination 12/21/2016   GERD (gastroesophageal reflux disease) 01/08/2016   Allergic  rhinitis 01/08/2016   Colon polyp, hyperplastic 12/10/2015   Vitamin D deficiency 12/10/2015   Reactive hypoglycemia 12/09/2015   Bilateral inguinal hernia 12/09/2015    No Known Allergies  Past Surgical History:  Procedure Laterality Date   COLONOSCOPY  2015   repeat in 3 years d/t polyps   COLONOSCOPY WITH PROPOFOL N/A 02/08/2017   Procedure: COLONOSCOPY WITH PROPOFOL;  Surgeon: [name], MD;  Location: [facility];  Service: Endoscopy;  Laterality: N/A;  pt requests early as possible   perforated eardrum  Right    POLYPECTOMY  02/08/2017   Procedure: POLYPECTOMY;  Surgeon: [name], MD;  Location: [facility];  Service: Endoscopy;;   TONSILLECTOMY      Social History   Tobacco Use   Smoking status: Never   Smokeless tobacco: Never  Vaping Use   Vaping Use: Never used  Substance Use Topics   Alcohol use: Yes    Alcohol/week: 2.0 standard drinks of alcohol    Types: 2 Cans of beer per week   Drug use: No     Medication list has been reviewed and updated.  Current Meds  Medication Sig   cetirizine (ZYRTEC) 10 MG tablet Take 10 mg by mouth daily. otc   Cholecalciferol (VITAMIN D3) 2000 units capsule Take 2,000 Units by mouth daily.   naproxen (NAPROSYN) 250 MG tablet Take 500 mg by mouth daily. For back    omeprazole (PRILOSEC) 40 MG capsule TAKE ONE CAPSULE BY MOUTH DAILY       05/19/2021    8:11 AM 04/02/2020    8:57 AM  GAD 7 : Generalized Anxiety Score  Nervous, Anxious, on Edge 0 0  Control/stop worrying 0 0  Worry too much - different things 0 0  Trouble relaxing 0 0  Restless 0 0  Easily annoyed or irritable 0 0  Afraid - awful might happen 0 0  Total GAD 7 Score 0 0  Anxiety Difficulty Not difficult at all Not difficult at all       05/19/2021    8:10 AM 04/02/2020    8:57 AM 05/15/2019    9:05 AM  Depression screen PHQ 2/9  Decreased Interest 0 0 0  Down, Depressed, Hopeless 0 0 0  PHQ - 2 Score 0 0 0  Altered sleeping 0 0   Tired, decreased energy 0 0   Change in appetite 0 0   Feeling bad or failure about yourself  0 0   Trouble concentrating 0 0   Moving slowly or fidgety/restless 0 0   Suicidal thoughts 0 0   PHQ-9 Score 0 0   Difficult doing work/chores Not difficult at all Not difficult at all     BP Readings from Last 3 Encounters:  05/21/22 114/82  05/19/21 128/74  05/15/20 104/68    Physical Exam Vitals and nursing note reviewed.  Constitutional:      Appearance: Normal appearance. He is well-developed.  HENT:     Head:  Normocephalic.     Right Ear: Tympanic membrane, ear canal and external ear normal.     Left Ear: Tympanic membrane, ear canal and external ear normal.     Nose: Nose normal.  Eyes:     Conjunctiva/sclera: Conjunctivae normal.     Pupils: Pupils are equal, round, and reactive to light.  Neck:     Thyroid: No thyromegaly.     Vascular: No carotid bruit.  Cardiovascular:     Rate and Rhythm: Normal rate and  regular rhythm.     Heart sounds: Normal heart sounds.  Pulmonary:     Effort: Pulmonary effort is normal.     Breath sounds: Normal breath sounds. No wheezing.  Chest:  Breasts:    Right: No mass.     Left: No mass.  Abdominal:     General: Bowel sounds are normal.     Palpations: Abdomen is soft.     Tenderness: There is no abdominal tenderness.  Musculoskeletal:        General: Normal range of motion.     Cervical back: Normal range of motion and neck supple.  Lymphadenopathy:     Cervical: No cervical adenopathy.  Skin:    General: Skin is warm and dry.  Neurological:     Mental Status: He is alert and oriented to person, place, and time.     Deep Tendon Reflexes: Reflexes are normal and symmetric.  Psychiatric:        Attention and Perception: Attention normal.        Mood and Affect: Mood normal.        Thought Content: Thought content normal.     Wt Readings from Last 3 Encounters:  05/21/22 190 lb (86.2 kg)  05/19/21 184 lb (83.5 kg)  05/15/20 183 lb (83 kg)    BP 114/82   Pulse (!) 55   Ht $R'6\' 3"'fA$  (1.905 m)   Wt 190 lb (86.2 kg)   SpO2 99%   BMI 23.75 kg/m   Assessment and Plan: 1. Annual physical exam Normal exam. Continue healthy diet, regular exercise Declines flu vaccine - CBC with Differential/Platelet - Comprehensive metabolic panel - Hemoglobin A1c - Lipid panel - PSA  2. Gastroesophageal reflux disease, unspecified whether esophagitis present Persistent fullness after eating with esophageal pressure Pain is controlled with PPI but  other symptoms are worsening Continue Omeprazole - CBC with Differential/Platelet - Ambulatory referral to Gastroenterology - omeprazole (PRILOSEC) 40 MG capsule; Take 1 capsule (40 mg total) by mouth daily.  Dispense: 90 capsule; Refill: 3  3. Prostate cancer screening DRE deferred - PSA  4. Screening for diabetes mellitus - Comprehensive metabolic panel - Hemoglobin A1c  5. Screening for lipid disorders - Lipid panel  6. Need for shingles vaccine First dose today - Varicella-zoster vaccine IM   Partially dictated using Editor, commissioning. Any errors are unintentional.  Halina Maidens, MD [facility]  05/21/2022

## 2022-05-22 LAB — COMPREHENSIVE METABOLIC PANEL
ALT: 28 IU/L (ref 0–44)
AST: 22 IU/L (ref 0–40)
Albumin/Globulin Ratio: 2.2 (ref 1.2–2.2)
Albumin: 4.6 g/dL (ref 3.9–4.9)
Alkaline Phosphatase: 66 IU/L (ref 44–121)
BUN/Creatinine Ratio: 20 (ref 10–24)
BUN: 18 mg/dL (ref 8–27)
Bilirubin Total: 0.6 mg/dL (ref 0.0–1.2)
CO2: 24 mmol/L (ref 20–29)
Calcium: 8.9 mg/dL (ref 8.6–10.2)
Chloride: 101 mmol/L (ref 96–106)
Creatinine, Ser: 0.91 mg/dL (ref 0.76–1.27)
Globulin, Total: 2.1 g/dL (ref 1.5–4.5)
Glucose: 100 mg/dL — ABNORMAL HIGH (ref 70–99)
Potassium: 4.4 mmol/L (ref 3.5–5.2)
Sodium: 138 mmol/L (ref 134–144)
Total Protein: 6.7 g/dL (ref 6.0–8.5)
eGFR: 95 mL/min/{1.73_m2} (ref >59)

## 2022-05-22 LAB — CBC WITH DIFFERENTIAL/PLATELET
Basophils Absolute: 0 10*3/uL (ref 0.0–0.2)
Basos: 1 %
EOS (ABSOLUTE): 0.1 10*3/uL (ref 0.0–0.4)
Eos: 2 %
Hematocrit: 49.3 % (ref 37.5–51.0)
Hemoglobin: 16.5 g/dL (ref 13.0–17.7)
Immature Grans (Abs): 0 10*3/uL (ref 0.0–0.1)
Immature Granulocytes: 0 %
Lymphocytes Absolute: 1 10*3/uL (ref 0.7–3.1)
Lymphs: 25 %
MCH: 30.8 pg (ref 26.6–33.0)
MCHC: 33.5 g/dL (ref 31.5–35.7)
MCV: 92 fL (ref 79–97)
Monocytes Absolute: 0.4 10*3/uL (ref 0.1–0.9)
Monocytes: 11 %
Neutrophils Absolute: 2.3 10*3/uL (ref 1.4–7.0)
Neutrophils: 61 %
Platelets: 192 10*3/uL (ref 150–450)
RBC: 5.35 x10E6/uL (ref 4.14–5.80)
RDW: 12 % (ref 11.6–15.4)
WBC: 3.8 10*3/uL (ref 3.4–10.8)

## 2022-05-22 LAB — LIPID PANEL
Chol/HDL Ratio: 3.5 ratio (ref 0.0–5.0)
Cholesterol, Total: 156 mg/dL (ref 100–199)
HDL: 45 mg/dL (ref >39)
LDL Chol Calc (NIH): 98 mg/dL (ref 0–99)
Triglycerides: 66 mg/dL (ref 0–149)
VLDL Cholesterol Cal: 13 mg/dL (ref 5–40)

## 2022-05-22 LAB — PSA: Prostate Specific Ag, Serum: 2.7 ng/mL (ref 0.0–4.0)

## 2022-05-22 LAB — HEMOGLOBIN A1C
Est. average glucose Bld gHb Est-mCnc: 117 mg/dL
Hgb A1c MFr Bld: 5.7 % — ABNORMAL HIGH (ref 4.8–5.6)

## 2022-06-04 ENCOUNTER — Telehealth: Payer: Self-pay | Admitting: Internal Medicine

## 2022-06-04 ENCOUNTER — Other Ambulatory Visit: Payer: Self-pay

## 2022-06-04 DIAGNOSIS — K219 Gastro-esophageal reflux disease without esophagitis: Secondary | ICD-10-CM

## 2022-06-04 NOTE — Telephone Encounter (Signed)
Pt called to ask for a new location for the GI ([facility]: Dr. Owens Shark) referral and it is for Acid reflux / please advise

## 2022-06-04 NOTE — Telephone Encounter (Signed)
Copied from [place] [id number]. Topic: Referral - Status >> Jun 04, 2022 10:39 AM Sabas Sous wrote: Reason for CRM: Pt called and is requesting to have his referral submitted to this location below for GI.   John Ashley  Fax: [phone]

## 2022-06-04 NOTE — Telephone Encounter (Signed)
Called and left patient a VM. After review patients chart, patient already has a GI referral placed to [facility] and he has not called them back to schedule an appt. Need to know if he is just going to call them back and make an appt, or does he want me to place a new referral for the new location.  Waiting for response from pt.

## 2022-06-04 NOTE — Telephone Encounter (Signed)
Pt informed and verbalized understanding.  KP 

## 2022-06-04 NOTE — Telephone Encounter (Signed)
Referral placed.  KP 

## 2022-06-08 NOTE — Telephone Encounter (Signed)
Called pt let him know the correct referral was placed on 10/13 and it can take 7-10 days to hear from someone. Pt verbalized understanding.  KP

## 2022-06-08 NOTE — Telephone Encounter (Signed)
This pt calling in very frustrated as office has sent referral wrong twice. Langley Gauss is not in today, Called office and CRM requested. Reverified everything with pt,Referral # [id number] is a correct referral but it was sent to [facility] in [place] that is not where Owens Shark is located. He is at [facility] in [place]. This is the correct fax # pt has stated, it is listed below [phone] His phone # is [phone]. If any questions fu with pt at [phone] Please the info on Epic for Owens Shark is incorrect.

## 2022-06-30 DIAGNOSIS — Z791 Long term (current) use of non-steroidal anti-inflammatories (NSAID): Secondary | ICD-10-CM | POA: Diagnosis not present

## 2022-06-30 DIAGNOSIS — Z86010 Personal history of colonic polyps: Secondary | ICD-10-CM | POA: Diagnosis not present

## 2022-06-30 DIAGNOSIS — K219 Gastro-esophageal reflux disease without esophagitis: Secondary | ICD-10-CM | POA: Diagnosis not present

## 2022-06-30 DIAGNOSIS — Z800 Family history of malignant neoplasm of digestive organs: Secondary | ICD-10-CM | POA: Diagnosis not present

## 2022-07-09 ENCOUNTER — Ambulatory Visit: Admission: RE | Admit: 2022-07-09 | Payer: 59 | Source: Home / Self Care | Admitting: Gastroenterology

## 2022-07-09 ENCOUNTER — Encounter: Admission: RE | Payer: Self-pay | Source: Home / Self Care

## 2022-07-09 SURGERY — EGD (ESOPHAGOGASTRODUODENOSCOPY)
Anesthesia: General

## 2022-07-30 ENCOUNTER — Ambulatory Visit: Payer: BC Managed Care – PPO | Admitting: Gastroenterology

## 2022-08-25 ENCOUNTER — Ambulatory Visit (INDEPENDENT_AMBULATORY_CARE_PROVIDER_SITE_OTHER): Payer: 59

## 2022-08-25 DIAGNOSIS — Z23 Encounter for immunization: Secondary | ICD-10-CM | POA: Diagnosis not present

## 2022-11-19 ENCOUNTER — Encounter: Payer: Self-pay | Admitting: Family Medicine

## 2022-11-19 ENCOUNTER — Ambulatory Visit: Payer: 59 | Admitting: Family Medicine

## 2022-11-19 VITALS — BP 96/62 | HR 57 | Ht 75.0 in | Wt 194.0 lb

## 2022-11-19 DIAGNOSIS — H663X1 Other chronic suppurative otitis media, right ear: Secondary | ICD-10-CM

## 2022-11-19 DIAGNOSIS — J301 Allergic rhinitis due to pollen: Secondary | ICD-10-CM

## 2022-11-19 MED ORDER — TRIAMCINOLONE ACETONIDE 55 MCG/ACT NA AERO: 2.0000 | INHALATION_SPRAY | Freq: Every day | NASAL | 12 refills | Status: AC

## 2022-11-19 MED ORDER — AMOXICILLIN-POT CLAVULANATE 875-125 MG PO TABS: 1.0000 | ORAL_TABLET | Freq: Two times a day (BID) | ORAL | 0 refills | Status: DC

## 2022-11-19 NOTE — Progress Notes (Signed)
Date:  11/19/2022   Name:  John Ashley   DOB:  Aug 02, 1961   MRN:  [id number]   Chief Complaint: ear pressure (Getting a lot of wax out with Q-Tips)  Ear Fullness  There is pain in the right ear. This is a new problem. The current episode started 1 to 4 weeks ago (4 weeks). The problem occurs constantly. The problem has been waxing and waning. There has been no fever. Associated symptoms include hearing loss and rhinorrhea. Pertinent negatives include no ear discharge. 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 The treatment provided mild relief.    Lab Results  Component Value Date   NA 138 05/21/2022   K 4.4 05/21/2022   CO2 24 05/21/2022   GLUCOSE 100 (H) 05/21/2022   BUN 18 05/21/2022   CREATININE 0.91 05/21/2022   CALCIUM 8.9 05/21/2022   EGFR 95 05/21/2022   GFRNONAA 93 05/15/2020   Lab Results  Component Value Date   CHOL 156 05/21/2022   HDL 45 05/21/2022   LDLCALC 98 05/21/2022   TRIG 66 05/21/2022   CHOLHDL 3.5 05/21/2022   Lab Results  Component Value Date   TSH 0.993 12/09/2015   Lab Results  Component Value Date   HGBA1C 5.7 (H) 05/21/2022   Lab Results  Component Value Date   WBC 3.8 05/21/2022   HGB 16.5 05/21/2022   HCT 49.3 05/21/2022   MCV 92 05/21/2022   PLT 192 05/21/2022   Lab Results  Component Value Date   ALT 28 05/21/2022   AST 22 05/21/2022   ALKPHOS 66 05/21/2022   BILITOT 0.6 05/21/2022   Lab Results  Component Value Date   VD25OH 48.9 05/10/2018     Review of Systems  HENT:  Positive for hearing loss, postnasal drip and rhinorrhea. Negative for ear discharge, ear pain, nosebleeds and sinus pressure.   Respiratory:  Negative for shortness of breath and wheezing.     Patient Active Problem List   Diagnosis Date Noted   DDD (degenerative disc disease), lumbar 10/22/2017   Personal history of colonic polyps    Enlarged prostate on rectal examination 12/21/2016   GERD (gastroesophageal reflux disease) 01/08/2016   Allergic  rhinitis 01/08/2016   Colon polyp, hyperplastic 12/10/2015   Vitamin D deficiency 12/10/2015   Reactive hypoglycemia 12/09/2015   Bilateral inguinal hernia 12/09/2015    No Known Allergies  Past Surgical History:  Procedure Laterality Date   COLONOSCOPY  2015   repeat in 3 years d/t polyps   COLONOSCOPY WITH PROPOFOL N/A 02/08/2017   Procedure: COLONOSCOPY WITH PROPOFOL;  Surgeon: [name], MD;  Location: [facility];  Service: Endoscopy;  Laterality: N/A;  pt requests early as possible   perforated eardrum Right    POLYPECTOMY  02/08/2017   Procedure: POLYPECTOMY;  Surgeon: [name], MD;  Location: [facility];  Service: Endoscopy;;   TONSILLECTOMY      Social History   Tobacco Use   Smoking status: Never   Smokeless tobacco: Never  Vaping Use   Vaping Use: Never used  Substance Use Topics   Alcohol use: Yes    Alcohol/week: 2.0 standard drinks of alcohol    Types: 2 Cans of beer per week   Drug use: No     Medication list has been reviewed and updated.  Current Meds  Medication Sig   cetirizine (ZYRTEC) 10 MG tablet Take 10 mg by mouth daily. otc   Cholecalciferol (VITAMIN D3) 2000 units capsule Take 2,000 Units  by mouth daily.   naproxen (NAPROSYN) 250 MG tablet Take 500 mg by mouth daily. For back        11/19/2022   11:06 AM 05/21/2022    8:03 AM 05/19/2021    8:11 AM 04/02/2020    8:57 AM  GAD 7 : Generalized Anxiety Score  Nervous, Anxious, on Edge 0 1 0 0  Control/stop worrying 0 0 0 0  Worry too much - different things 0 1 0 0  Trouble relaxing 0 1 0 0  Restless 0 0 0 0  Easily annoyed or irritable 0 1 0 0  Afraid - awful might happen 0 0 0 0  Total GAD 7 Score 0 4 0 0  Anxiety Difficulty Not difficult at all Somewhat difficult Not difficult at all Not difficult at all       11/19/2022   11:06 AM 05/21/2022    8:03 AM 05/19/2021    8:10 AM  Depression screen PHQ 2/9  Decreased Interest 0 0 0  Down, Depressed, Hopeless 0 0 0   PHQ - 2 Score 0 0 0  Altered sleeping 0 0 0  Tired, decreased energy 0 0 0  Change in appetite 0 0 0  Feeling bad or failure about yourself  0 0 0  Trouble concentrating 0 1 0  Moving slowly or fidgety/restless 0 0 0  Suicidal thoughts 0 0 0  PHQ-9 Score 0 1 0  Difficult doing work/chores Not difficult at all Not difficult at all Not difficult at all    BP Readings from Last 3 Encounters:  11/19/22 96/62  05/21/22 114/82  05/19/21 128/74    Physical Exam Vitals and nursing note reviewed.  HENT:     Head: Normocephalic.     Right Ear: Decreased hearing noted. Tympanic membrane is erythematous and bulging.     Left Ear: Tympanic membrane normal.     Ears:     Comments: ? purulence    Mouth/Throat:     Lips: Pink.     Mouth: Mucous membranes are moist.     Tongue: No lesions.  Cardiovascular:     Heart sounds: S1 normal and S2 normal.     No systolic murmur is present.     No diastolic murmur is present.     No S3 or S4 sounds.  Pulmonary:     Breath sounds: No decreased breath sounds, wheezing, rhonchi or rales.  Musculoskeletal:     Cervical back: Neck supple.  Lymphadenopathy:     Head:     Right side of head: No submental, submandibular or tonsillar adenopathy.     Left side of head: No submental, submandibular or tonsillar adenopathy.     Cervical:     Right cervical: No superficial cervical adenopathy.    Left cervical: No superficial cervical adenopathy.  Neurological:     Mental Status: He is alert.     Wt Readings from Last 3 Encounters:  11/19/22 194 lb (88 kg)  05/21/22 190 lb (86.2 kg)  05/19/21 184 lb (83.5 kg)    BP 96/62   Pulse (!) 57   Ht 6\' 3"  (1.905 m)   Wt 194 lb (88 kg)   SpO2 98%   BMI 24.25 kg/m   Assessment and Plan:  1. Chronic suppurative otitis media of right ear, unspecified otitis media location Relatively new onset over the past month of ear fullness.  In the past patient has had tympanic membrane with perforation of  unknown  etiology but no recurrence over the years.  Examination notes an erythematous with what looks like to be purulent cyst behind the tympanic membrane with no canal drainage.  This would suggest a chronic otitis media.  We will treat with Augmentin 875 mg twice a day and referral to ear nose and throat to have recheck afterwards to make sure that the entire clearance has been done. - amoxicillin-clavulanate (AUGMENTIN) 875-125 MG tablet; Take 1 tablet by mouth 2 (two) times daily.  Dispense: 20 tablet; Refill: 0 - Ambulatory referral to ENT  2. Seasonal allergic rhinitis due to pollen Patient with a history of allergic rhinitis and has had desensitization immunotherapy for allergens in the past/i.e. injections.  Have discussed that the trio of nasal steroid/Sudafed/nasal saline lavage would be suggested to open up eustachian tube drainage which would facilitate further clearance of the ear infection. - triamcinolone (NASACORT) 55 MCG/ACT AERO nasal inhaler; Place 2 sprays into the nose daily.  Dispense: 1 each; Refill: 12    Otilio Miu, MD

## 2022-11-20 DIAGNOSIS — K219 Gastro-esophageal reflux disease without esophagitis: Secondary | ICD-10-CM | POA: Diagnosis not present

## 2022-11-20 DIAGNOSIS — Z809 Family history of malignant neoplasm, unspecified: Secondary | ICD-10-CM | POA: Diagnosis not present

## 2022-11-20 DIAGNOSIS — M1990 Unspecified osteoarthritis, unspecified site: Secondary | ICD-10-CM | POA: Diagnosis not present

## 2022-11-20 DIAGNOSIS — Z833 Family history of diabetes mellitus: Secondary | ICD-10-CM | POA: Diagnosis not present

## 2022-12-25 DIAGNOSIS — H6531 Chronic mucoid otitis media, right ear: Secondary | ICD-10-CM | POA: Diagnosis not present

## 2022-12-25 DIAGNOSIS — H722X1 Other marginal perforations of tympanic membrane, right ear: Secondary | ICD-10-CM | POA: Diagnosis not present

## 2022-12-25 DIAGNOSIS — H6123 Impacted cerumen, bilateral: Secondary | ICD-10-CM | POA: Diagnosis not present

## 2023-01-14 DIAGNOSIS — H722X1 Other marginal perforations of tympanic membrane, right ear: Secondary | ICD-10-CM | POA: Diagnosis not present

## 2023-04-09 DIAGNOSIS — H7201 Central perforation of tympanic membrane, right ear: Secondary | ICD-10-CM | POA: Diagnosis not present

## 2023-04-09 DIAGNOSIS — H90A31 Mixed conductive and sensorineural hearing loss, unilateral, right ear with restricted hearing on the contralateral side: Secondary | ICD-10-CM | POA: Diagnosis not present

## 2023-04-12 ENCOUNTER — Other Ambulatory Visit: Payer: Self-pay | Admitting: Otolaryngology

## 2023-04-29 ENCOUNTER — Encounter: Payer: Self-pay | Admitting: Otolaryngology

## 2023-05-03 ENCOUNTER — Encounter: Payer: Self-pay | Admitting: Otolaryngology

## 2023-05-04 ENCOUNTER — Other Ambulatory Visit: Payer: Self-pay

## 2023-05-04 MED ORDER — CIPROFLOXACIN-DEXAMETHASONE 0.3-0.1 % OT SUSP
4.0000 [drp] | Freq: Two times a day (BID) | OTIC | 0 refills | Status: AC
  Filled 2023-05-04: qty 7.5, 5d supply, fill #0

## 2023-05-05 ENCOUNTER — Other Ambulatory Visit: Payer: Self-pay

## 2023-05-11 ENCOUNTER — Encounter
Admission: RE | Disposition: A | Discharge status: Home / Self Care | Payer: Self-pay | Source: Home / Self Care | Attending: Otolaryngology

## 2023-05-11 ENCOUNTER — Ambulatory Visit
Admission: RE | Admit: 2023-05-11 | Discharge: 2023-05-11 | Disposition: A | Discharge status: Home / Self Care | Payer: 59 | Attending: Otolaryngology | Admitting: Otolaryngology

## 2023-05-11 ENCOUNTER — Other Ambulatory Visit: Payer: Self-pay

## 2023-05-11 ENCOUNTER — Ambulatory Visit: Payer: 59 | Admitting: Anesthesiology

## 2023-05-11 ENCOUNTER — Encounter: Payer: Self-pay | Admitting: Otolaryngology

## 2023-05-11 DIAGNOSIS — Z8616 Personal history of COVID-19: Secondary | ICD-10-CM | POA: Insufficient documentation

## 2023-05-11 DIAGNOSIS — H7291 Unspecified perforation of tympanic membrane, right ear: Secondary | ICD-10-CM | POA: Diagnosis not present

## 2023-05-11 DIAGNOSIS — K219 Gastro-esophageal reflux disease without esophagitis: Secondary | ICD-10-CM | POA: Insufficient documentation

## 2023-05-11 DIAGNOSIS — H7201 Central perforation of tympanic membrane, right ear: Secondary | ICD-10-CM | POA: Diagnosis not present

## 2023-05-11 HISTORY — DX: Other specified postprocedural states: Z98.890

## 2023-05-11 HISTORY — PX: TYMPANOPLASTY: SHX33

## 2023-05-11 SURGERY — TYMPANOPLASTY
Anesthesia: General | Laterality: Right

## 2023-05-11 MED ORDER — ACETAMINOPHEN 10 MG/ML IV SOLN
1000.0000 mg | Freq: Once | INTRAVENOUS | Status: AC
  Administered 2023-05-11: 1000 mg via INTRAVENOUS

## 2023-05-11 MED ORDER — MIDAZOLAM HCL 5 MG/5ML IJ SOLN
INTRAMUSCULAR | Status: DC | PRN
  Administered 2023-05-11: 2 mg via INTRAVENOUS

## 2023-05-11 MED ORDER — LIDOCAINE-EPINEPHRINE 1 %-1:100000 IJ SOLN
INTRAMUSCULAR | Status: DC | PRN
  Administered 2023-05-11: 1 mL
  Administered 2023-05-11: 2 mL

## 2023-05-11 MED ORDER — CIPROFLOXACIN-DEXAMETHASONE 0.3-0.1 % OT SUSP: 4.0000 [drp] | Freq: Two times a day (BID) | OTIC | Status: DC

## 2023-05-11 MED ORDER — BACITRACIN 500 UNIT/GM EX OINT
TOPICAL_OINTMENT | CUTANEOUS | Status: DC | PRN
  Administered 2023-05-11: 2 via TOPICAL

## 2023-05-11 MED ORDER — FAMOTIDINE IN NACL 20-0.9 MG/50ML-% IV SOLN
20.0000 mg | Freq: Once | INTRAVENOUS | Status: AC
  Administered 2023-05-11: 20 mg via INTRAVENOUS

## 2023-05-11 MED ORDER — LACTATED RINGERS IV SOLN: INTRAVENOUS | Status: DC

## 2023-05-11 MED ORDER — CIPROFLOXACIN-DEXAMETHASONE 0.3-0.1 % OT SUSP
OTIC | Status: DC | PRN
  Administered 2023-05-11: 4 [drp] via OTIC

## 2023-05-11 MED ORDER — GLYCOPYRROLATE 0.2 MG/ML IJ SOLN
INTRAMUSCULAR | Status: DC | PRN
  Administered 2023-05-11 (×2): .2 mg via INTRAVENOUS

## 2023-05-11 MED ORDER — GELATIN ABSORBABLE 12-7 MM EX MISC
CUTANEOUS | Status: DC | PRN
  Administered 2023-05-11: 1

## 2023-05-11 MED ORDER — LIDOCAINE HCL (CARDIAC) PF 100 MG/5ML IV SOSY
PREFILLED_SYRINGE | INTRAVENOUS | Status: DC | PRN
  Administered 2023-05-11: 100 mg via INTRAVENOUS

## 2023-05-11 MED ORDER — SUCCINYLCHOLINE CHLORIDE 200 MG/10ML IV SOSY
PREFILLED_SYRINGE | INTRAVENOUS | Status: DC | PRN
  Administered 2023-05-11: 100 mg via INTRAVENOUS

## 2023-05-11 MED ORDER — ONDANSETRON HCL 4 MG/2ML IJ SOLN
INTRAMUSCULAR | Status: DC | PRN
  Administered 2023-05-11: 4 mg via INTRAVENOUS

## 2023-05-11 MED ORDER — PROPOFOL 10 MG/ML IV BOLUS
INTRAVENOUS | Status: DC | PRN
  Administered 2023-05-11: 200 mg via INTRAVENOUS

## 2023-05-11 MED ORDER — HYDROCODONE-ACETAMINOPHEN 5-325 MG PO TABS
1.0000 | ORAL_TABLET | Freq: Four times a day (QID) | ORAL | 0 refills | Status: AC | PRN
Start: 2023-05-11 — End: ?

## 2023-05-11 MED ORDER — EPHEDRINE SULFATE (PRESSORS) 50 MG/ML IJ SOLN
INTRAMUSCULAR | Status: DC | PRN
Start: 2023-05-11 — End: 2023-05-11
  Administered 2023-05-11 (×5): 5 mg via INTRAVENOUS

## 2023-05-11 MED ORDER — DEXAMETHASONE SODIUM PHOSPHATE 4 MG/ML IJ SOLN
INTRAMUSCULAR | Status: DC | PRN
  Administered 2023-05-11: 8 mg via INTRAVENOUS

## 2023-05-11 MED ORDER — OFLOXACIN 0.3 % OP SOLN: 4.0000 [drp] | Freq: Two times a day (BID) | OPHTHALMIC | 4 refills | Status: AC

## 2023-05-11 MED ORDER — FENTANYL CITRATE (PF) 100 MCG/2ML IJ SOLN
INTRAMUSCULAR | Status: DC | PRN
  Administered 2023-05-11: 100 ug via INTRAVENOUS

## 2023-05-11 SURGICAL SUPPLY — 38 items
ADH LQ OCL WTPRF AMP STRL LF (MISCELLANEOUS) ×1
ADHESIVE MASTISOL STRL (MISCELLANEOUS) ×1 IMPLANT
BALL CTTN LRG ABS STRL LF (GAUZE/BANDAGES/DRESSINGS) ×1
BLADE EAR TYMPAN 2.5 60D BEAV (BLADE) ×1 IMPLANT
CANISTER SUCT 1200ML W/VALVE (MISCELLANEOUS) ×1 IMPLANT
CATH IV 18X1 1/4 SAFELET (CATHETERS) ×1 IMPLANT
COTTONBALL LRG STERILE PKG (GAUZE/BANDAGES/DRESSINGS) ×1 IMPLANT
DRAPE MICROSCOPE ZEISS INVISI (DRAPES) ×1 IMPLANT
DRAPE SURG 17X11 SM STRL (DRAPES) ×2 IMPLANT
DRAPE TOWEL STERILE LF 18X12 (DRAPES) IMPLANT
DRSG GLASSCOCK MASTOID ADT (GAUZE/BANDAGES/DRESSINGS) IMPLANT
ELECT CAUTERY BLADE TIP 2.5 (TIP)
ELECTRODE CAUTERY BLDE TIP 2.5 (TIP) IMPLANT
GLOVE BIOGEL PI IND STRL 8 (GLOVE) IMPLANT
GLOVE PI ULTRA LF STRL 7.5 (GLOVE) IMPLANT
GLOVE SURG ENC MOIS LTX SZ7.5 (GLOVE) ×2 IMPLANT
GLOVE SURG TRIUMPH 8.0 PF LTX (GLOVE) ×1 IMPLANT
GOWN STRL REUS W/ TWL LRG LVL3 (GOWN DISPOSABLE) ×1 IMPLANT
GOWN STRL REUS W/TWL LRG LVL3 (GOWN DISPOSABLE) ×1
IV CATH 18X1 1/4 SAFELET (CATHETERS) ×1
KIT TURNOVER KIT A (KITS) ×1 IMPLANT
NDL HYPO 18GX1.5 BLUNT FILL (NEEDLE) IMPLANT
NEEDLE HYPO 18GX1.5 BLUNT FILL (NEEDLE) ×1
PACK ENT CUSTOM (PACKS) ×1 IMPLANT
PENCIL SMOKE EVACUATOR (MISCELLANEOUS) IMPLANT
SLEEVE COMPRESS THIGH LG VP530 (MISCELLANEOUS) IMPLANT
SLEEVE PROTECTION STRL DISP (MISCELLANEOUS) ×2 IMPLANT
SOL PREP PVP 2OZ (MISCELLANEOUS) ×1
SOLUTION PREP PVP 2OZ (MISCELLANEOUS) ×1 IMPLANT
SPONGE SURGIFOAM ABS GEL 12-7 (HEMOSTASIS) IMPLANT
STRAP BODY AND KNEE 60X3 (MISCELLANEOUS) ×1 IMPLANT
SUT PLAIN GUT FAST 5-0 (SUTURE) ×1 IMPLANT
SUT VIC AB 4-0 RB1 27 (SUTURE) ×1
SUT VIC AB 4-0 RB1 27X BRD (SUTURE) ×1 IMPLANT
SYR 10ML LL (SYRINGE) ×1 IMPLANT
SYR 3ML LL SCALE MARK (SYRINGE) ×1 IMPLANT
SYR EAR/ULCER 2OZ (SYRINGE) ×1 IMPLANT
WATER STERILE IRR 250ML POUR (IV SOLUTION) IMPLANT

## 2023-05-11 NOTE — Anesthesia Postprocedure Evaluation (Signed)
Anesthesia Post Note  Patient: John Ashley  Procedure(s) Performed: TYMPANOPLASTY (Right)  Patient location during evaluation: PACU Anesthesia Type: General Level of consciousness: awake and alert Pain management: pain level controlled Vital Signs Assessment: post-procedure vital signs reviewed and stable Respiratory status: spontaneous breathing, nonlabored ventilation, respiratory function stable and patient connected to nasal cannula oxygen Cardiovascular status: blood pressure returned to baseline and stable Postop Assessment: no apparent nausea or vomiting Anesthetic complications: no   No notable events documented.   Last Vitals:  Vitals:   05/11/23 1130 05/11/23 1156  BP: 125/74   Pulse: 77 80  Resp: 16   Temp:    SpO2: 98% 98%    Last Pain:  Vitals:   05/11/23 1156  PainSc: 0-No pain                 Jhoselin Crume C Sheana Bir

## 2023-05-11 NOTE — Op Note (Signed)
05/11/2023  10:48 AM    John Ashley  [id number]   Pre-Op Diagnosis:  Right Tympanic membrane perforation  Post-op Diagnosis: Right Tympanic membrane perforation  Procedure: Right Tympanoplasty  Surgeon:  Sandi Mealy  Anesthesia:  General endotracheal anesthesia  EBL:  <25 cc  Complications:  None  Findings: 3-4 mm posterior TM perforation. Synechia between TM and incus  Procedure: The patient was taken to the Operating Room and placed in the supine position.  After induction of general endotracheal anesthesia, the patient was turned 90 degrees. After a time-out confirming the consented procedure and site, 1% lidocaine with epinephrine 1:100,000 was injected into the postauricular region and, under visualization with the operating microscope, into the canal skin in the posterior and inferior canal. The right ear was then prepped and draped in the usual sterile fashion. The ear was evaluated under the operating microscope through an ear speculum, and the canal cleaned and irrigated with saline, and the perforation inspected. The findings were as described above. The margin of the perforation was carefully de-epithelialized with a pick and cup forceps, scraping the undersurface of the margin to remove all epithelium.  Given the posterior location of the perforation and small size, it was felt a transcanal approach was feasible and the graft could be harvested from the tragus, so the tragus was injected with 1% lidocaine with epinephrine 1:100,000. An incision was made with a 15 blade in the tragus. The incision was carried down through the subcutaneous tissues to the tragal cartilage. The tragal perichondrium was then elevated, and scissors were used to harvest an adequately sized perichondrial graft, which was set aside, pressed and dried for later use. Hemostasis was obtained and the wound closed with 5-0 fast absorbing gut suture.  Vertical canal incisions were then made at 12:00 and  6:00, followed by a horizontal incision approximately 4mm posterior to the annulus. A weapon was used to elevate the canal skin down to the annulus, and the annulus was then carefully elevated from the tympanic ring with a Rosen needle, taking care to avoid injury to the Chorda Tympani nerve, which was preserved. Synechiae bewteen the TM and the chorda tympani were divided, along with a small synechia between the TM and the incus. The tympanomeatal flap was elevated and the middle ear inspected. The ossicles were inspected and palpated to confirm mobility. The middle ear was then irrigated with saline and suctioned. The graft was then  placed into position under the perforation. Ciprodex moistened Gelfoam was used to pack the middle ear, supporting the graft against the undersurface of the tympanic membrane. The tympanomeatal flap was then placed back into anatomic position, and good placement of the graft confirmed. The canal was then packed with Ciprodex moistened Gelfoam. Bacitracin ointment was applied into the canal. A cotton ball was placed at the external meatus, and a Glasscock dressing applied to the ear.   The patient was then returned to the anesthesiologist for awakening, and was taken to the Recovery Room in stable condition.  Disposition:   PACU then discharge home  Plan: Remove the ear dressing as instructed, then start Floxin ear drops as prescribed and water precautions.  Antibiotic ointment to the tragal wound. Follow-up at my office as scheduled.  Sandi Mealy 05/11/2023 10:48 AM

## 2023-05-11 NOTE — Anesthesia Preprocedure Evaluation (Signed)
Anesthesia Evaluation  Patient identified by MRN, date of birth, ID band Patient awake    Reviewed: Allergy & Precautions, H&P , NPO status , Patient's Chart, lab work & pertinent test results  Airway Mallampati: I  TM Distance: >3 FB Neck ROM: Full    Dental no notable dental hx.  Has "a lot of dental work" but none on upper four central and lateral incisors:   Pulmonary neg pulmonary ROS   Pulmonary exam normal breath sounds clear to auscultation       Cardiovascular negative cardio ROS Normal cardiovascular exam Rhythm:Regular Rate:Normal     Neuro/Psych negative neurological ROS  negative psych ROS   GI/Hepatic negative GI ROS, Neg liver ROS,GERD  Poorly Controlled,,Severe GERD only partly controlled by meds; ordered pepcid 20 mg IV for today as well   Endo/Other  negative endocrine ROS    Renal/GU negative Renal ROS  negative genitourinary   Musculoskeletal  (+) Arthritis ,    Abdominal   Peds negative pediatric ROS (+)  Hematology negative hematology ROS (+)   Anesthesia Other Findings Allergy GERD (gastroesophageal reflux disease) Hypoglycemia Double vision    Reproductive/Obstetrics negative OB ROS                             Anesthesia Physical Anesthesia Plan  ASA: 2  Anesthesia Plan: General   Post-op Pain Management: Ofirmev IV (intra-op)   Induction: Intravenous, Rapid sequence and Cricoid pressure planned  PONV Risk Score and Plan:   Airway Management Planned: Oral ETT  Additional Equipment:   Intra-op Plan:   Post-operative Plan: Extubation in OR  Informed Consent: I have reviewed the patients History and Physical, chart, labs and discussed the procedure including the risks, benefits and alternatives for the proposed anesthesia with the patient or authorized representative who has indicated his/her understanding and acceptance.     Dental Advisory  Given  Plan Discussed with: Anesthesiologist, CRNA and Surgeon  Anesthesia Plan Comments: (Patient consented for risks of anesthesia including but not limited to:  - adverse reactions to medications - damage to eyes, teeth, lips or other oral mucosa - nerve damage due to positioning  - sore throat or hoarseness - Damage to heart, brain, nerves, lungs, other parts of body or loss of life  Patient voiced understanding.)       Anesthesia Quick Evaluation

## 2023-05-11 NOTE — Anesthesia Procedure Notes (Signed)
Procedure Name: Intubation Date/Time: 05/11/2023 9:15 AM  Performed by: Domenic Moras, CRNAPre-anesthesia Checklist: Patient identified, Emergency Drugs available, Suction available and Patient being monitored Patient Re-evaluated:Patient Re-evaluated prior to induction Oxygen Delivery Method: Circle system utilized Preoxygenation: Pre-oxygenation with 100% oxygen Induction Type: IV induction and Rapid sequence Laryngoscope Size: Mac and 4 Grade View: Grade I Tube type: Oral Tube size: 8.0 mm Number of attempts: 1 Airway Equipment and Method: Stylet and Oral airway Placement Confirmation: ETT inserted through vocal cords under direct vision, positive ETCO2 and breath sounds checked- equal and bilateral Secured at: 22 cm Tube secured with: Tape Dental Injury: Teeth and Oropharynx as per pre-operative assessment

## 2023-05-11 NOTE — H&P (Signed)
John Ashley, John Ashley [id number] [birth year]  Date of Admission: @TODAY @ Admitting Physician: Sandi Mealy  Chief Complaint: Chronic OM  HPI: This 62 y.o. 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 No recent ear drainage though he did have COVID 2 weeks ago.   Medications:  Medications Prior to Admission  Medication Sig Dispense Refill   cetirizine (ZYRTEC) 10 MG tablet Take 10 mg by mouth daily. otc     Cholecalciferol (VITAMIN D3) 2000 units capsule Take 2,000 Units by mouth daily.     naproxen (NAPROSYN) 250 MG tablet Take 500 mg by mouth daily. For back      pantoprazole (PROTONIX) 40 MG tablet Take 40 mg by mouth daily.     triamcinolone (NASACORT) 55 MCG/ACT AERO nasal inhaler Place 2 sprays into the nose daily. 1 each 12   amoxicillin-clavulanate (AUGMENTIN) 875-125 MG tablet Take 1 tablet by mouth 2 (two) times daily. (Patient not taking: Reported on 04/29/2023) 20 tablet 0   ciprofloxacin-dexamethasone (CIPRODEX) OTIC suspension Place 4 drops into the right ear 2 (two) times daily for 5 days. DOS 05/11/23. 7.5 mL 0   omeprazole (PRILOSEC) 40 MG capsule Take 1 capsule (40 mg total) by mouth daily. (Patient not taking: Reported on 11/19/2022) 90 capsule 3    Allergies: No Known Allergies  PMH:  Past Medical History:  Diagnosis Date   Allergy    Double vision    follows eye doctor    GERD (gastroesophageal reflux disease)    Hypoglycemia     Fam Hx:  Family History  Problem Relation Age of Onset   Diabetes Mother    Diabetes Father    Colon cancer Father 72   Colon cancer Brother 66   Cancer Paternal Grandmother    Cancer Paternal Grandfather     Soc Hx:  Social History   Socioeconomic History   Marital status: Married    Spouse name: Not on file   Number of children: Not on file   Years of education: Not on file   Highest education level: Not on file  Occupational History   Not on file  Tobacco Use   Smoking status:  Never   Smokeless tobacco: Never  Vaping Use   Vaping status: Never Used  Substance and Sexual Activity   Alcohol use: Yes    Alcohol/week: 2.0 standard drinks of alcohol    Types: 2 Cans of beer per week   Drug use: No   Sexual activity: Yes  Other Topics Concern   Not on file  Social History Narrative   Not on file   Social Determinants of Health   Financial Resource Strain: Unknown (05/10/2018)   Overall Financial Resource Strain (CARDIA)    Difficulty of Paying Living Expenses: Patient declined  Food Insecurity: Unknown (05/10/2018)   Hunger Vital Sign    Worried About Running Out of Food in the Last Year: Patient declined    Ran Out of Food in the Last Year: Patient declined  Transportation Needs: Unknown (05/10/2018)   PRAPARE - Administrator, Civil Service (Medical): Patient declined    Lack of Transportation (Non-Medical): Patient declined  Physical Activity: Unknown (05/10/2018)   Exercise Vital Sign    Days of Exercise per Week: Patient declined    Minutes of Exercise per Session: Patient declined  Stress: Not on file  Social Connections: Unknown (05/10/2018)   Social Connection and Isolation Panel [NHANES]    Frequency of Communication  with Friends and Family: Patient declined    Frequency of Social Gatherings with Friends and Family: Patient declined    Attends Religious Services: Patient declined    Database administrator or Organizations: Patient declined    Attends Banker Meetings: Patient declined    Marital Status: Patient declined  Intimate Partner Violence: Unknown (05/10/2018)   Humiliation, Afraid, Rape, and Kick questionnaire    Fear of Current or Ex-Partner: Patient declined    Emotionally Abused: Patient declined    Physically Abused: Patient declined    Sexually Abused: Patient declined    PSH:  Past Surgical History:  Procedure Laterality Date   COLONOSCOPY  2015   repeat in 3 years d/t polyps   COLONOSCOPY WITH  PROPOFOL N/A 02/08/2017   Procedure: COLONOSCOPY WITH PROPOFOL;  Surgeon: Midge Minium, MD;  Location: [facility];  Service: Endoscopy;  Laterality: N/A;  pt requests early as possible   perforated eardrum Right    POLYPECTOMY  02/08/2017   Procedure: POLYPECTOMY;  Surgeon: Midge Minium, MD;  Location: [facility];  Service: Endoscopy;;   TONSILLECTOMY    .   PHYSICAL EXAM  Vitals: Blood pressure 126/79, pulse (!) 55, temperature 97.9 F (36.6 C), resp. rate 18, height 6\' 3"  (1.905 m), weight 84.8 kg, SpO2 100%.. General: Well-developed, Well-nourished in no acute distress Mood: Mood and affect well adjusted, pleasant and cooperative. Orientation: Grossly alert and oriented. Ear: Right TM with 3 mm posterior perforation, no infection Vocal Quality: No hoarseness. Communicates verbally. head and Face: NCAT. No facial asymmetry. No visible skin lesions. No significant facial scars. No tenderness with sinus percussion. Facial strength normal and symmetric. Respiratory: Normal respiratory effort without labored breathing. Lungs CTA bilaterally Cardiovascular: Heart exam shows regular rate and rhythm  ASSESSMENT: Right TM perforation  PLAN: Right tympanoplasty   Sandi Mealy 05/11/2023 8:15 AM

## 2023-05-11 NOTE — Transfer of Care (Signed)
Immediate Anesthesia Transfer of Care Note  Patient: John Ashley  Procedure(s) Performed: TYMPANOPLASTY (Right)  Patient Location: PACU  Anesthesia Type: General  Level of Consciousness: awake, alert  and patient cooperative  Airway and Oxygen Therapy: Patient Spontanous Breathing and Patient connected to supplemental oxygen  Post-op Assessment: Post-op Vital signs reviewed, Patient's Cardiovascular Status Stable, Respiratory Function Stable, Patent Airway and No signs of Nausea or vomiting  Post-op Vital Signs: Reviewed and stable  Complications: No notable events documented.

## 2023-05-12 ENCOUNTER — Encounter: Payer: Self-pay | Admitting: Otolaryngology

## 2023-05-25 ENCOUNTER — Encounter: Payer: Self-pay | Admitting: Internal Medicine

## 2023-06-30 DIAGNOSIS — Z13220 Encounter for screening for lipoid disorders: Secondary | ICD-10-CM | POA: Diagnosis not present

## 2023-06-30 DIAGNOSIS — K219 Gastro-esophageal reflux disease without esophagitis: Secondary | ICD-10-CM | POA: Diagnosis not present

## 2023-06-30 DIAGNOSIS — M545 Low back pain, unspecified: Secondary | ICD-10-CM | POA: Diagnosis not present

## 2023-06-30 DIAGNOSIS — Z800 Family history of malignant neoplasm of digestive organs: Secondary | ICD-10-CM | POA: Diagnosis not present

## 2023-06-30 DIAGNOSIS — Z131 Encounter for screening for diabetes mellitus: Secondary | ICD-10-CM | POA: Diagnosis not present

## 2023-06-30 DIAGNOSIS — Z1159 Encounter for screening for other viral diseases: Secondary | ICD-10-CM | POA: Diagnosis not present

## 2023-06-30 DIAGNOSIS — Z1389 Encounter for screening for other disorder: Secondary | ICD-10-CM | POA: Diagnosis not present

## 2023-06-30 DIAGNOSIS — E559 Vitamin D deficiency, unspecified: Secondary | ICD-10-CM | POA: Diagnosis not present

## 2023-07-16 DIAGNOSIS — Z113 Encounter for screening for infections with a predominantly sexual mode of transmission: Secondary | ICD-10-CM | POA: Diagnosis not present

## 2023-07-16 DIAGNOSIS — R300 Dysuria: Secondary | ICD-10-CM | POA: Diagnosis not present

## 2023-07-16 DIAGNOSIS — N390 Urinary tract infection, site not specified: Secondary | ICD-10-CM | POA: Diagnosis not present

## 2023-07-19 DIAGNOSIS — L821 Other seborrheic keratosis: Secondary | ICD-10-CM | POA: Diagnosis not present

## 2023-07-19 DIAGNOSIS — D225 Melanocytic nevi of trunk: Secondary | ICD-10-CM | POA: Diagnosis not present

## 2023-08-05 DIAGNOSIS — J069 Acute upper respiratory infection, unspecified: Secondary | ICD-10-CM | POA: Diagnosis not present

## 2023-08-05 DIAGNOSIS — H903 Sensorineural hearing loss, bilateral: Secondary | ICD-10-CM | POA: Diagnosis not present
